# Patient Record
Sex: Male | Born: 1975 | Race: Black or African American | Hispanic: No | Marital: Married | Smoking: Current every day smoker
Health system: Southern US, Community
[De-identification: ages and names within clinical notes are randomized; demographics above are authoritative.]

## PROBLEM LIST (undated history)

## (undated) DIAGNOSIS — K5090 Crohn's disease, unspecified, without complications: Secondary | ICD-10-CM

## (undated) DIAGNOSIS — D649 Anemia, unspecified: Secondary | ICD-10-CM

## (undated) DIAGNOSIS — Z87442 Personal history of urinary calculi: Secondary | ICD-10-CM

## (undated) DIAGNOSIS — B009 Herpesviral infection, unspecified: Secondary | ICD-10-CM

## (undated) DIAGNOSIS — R011 Cardiac murmur, unspecified: Secondary | ICD-10-CM

## (undated) DIAGNOSIS — K589 Irritable bowel syndrome without diarrhea: Secondary | ICD-10-CM

## (undated) HISTORY — PX: LITHOTRIPSY: SUR834

## (undated) HISTORY — PX: WISDOM TOOTH EXTRACTION: SHX21

## (undated) HISTORY — PX: VASECTOMY: SHX75

## (undated) HISTORY — DX: Irritable bowel syndrome without diarrhea: K58.9

---

## 2012-03-01 ENCOUNTER — Other Ambulatory Visit (HOSPITAL_COMMUNITY): Payer: Self-pay

## 2012-03-02 ENCOUNTER — Ambulatory Visit: Admit: 2012-03-02 | Payer: Self-pay | Admitting: Urology

## 2012-03-02 SURGERY — CYSTOSCOPY, WITH CALCULUS REMOVAL USING BASKET
Anesthesia: Choice | Laterality: Right

## 2016-03-13 ENCOUNTER — Encounter (INDEPENDENT_AMBULATORY_CARE_PROVIDER_SITE_OTHER): Payer: Self-pay | Admitting: Internal Medicine

## 2016-03-26 ENCOUNTER — Ambulatory Visit (INDEPENDENT_AMBULATORY_CARE_PROVIDER_SITE_OTHER): Payer: Self-pay | Admitting: Internal Medicine

## 2016-03-26 ENCOUNTER — Encounter (INDEPENDENT_AMBULATORY_CARE_PROVIDER_SITE_OTHER): Payer: Self-pay | Admitting: Internal Medicine

## 2016-04-08 ENCOUNTER — Encounter (INDEPENDENT_AMBULATORY_CARE_PROVIDER_SITE_OTHER): Payer: Self-pay | Admitting: Internal Medicine

## 2016-04-08 ENCOUNTER — Ambulatory Visit (INDEPENDENT_AMBULATORY_CARE_PROVIDER_SITE_OTHER): Payer: Self-pay | Admitting: Internal Medicine

## 2016-04-08 DIAGNOSIS — K580 Irritable bowel syndrome with diarrhea: Secondary | ICD-10-CM

## 2016-04-08 DIAGNOSIS — K589 Irritable bowel syndrome without diarrhea: Secondary | ICD-10-CM

## 2016-04-08 HISTORY — DX: Irritable bowel syndrome, unspecified: K58.9

## 2016-04-08 MED ORDER — DICYCLOMINE HCL 10 MG PO CAPS: 10.0000 mg | ORAL_CAPSULE | Freq: Two times a day (BID) | ORAL | 3 refills | Status: DC

## 2016-04-08 NOTE — Progress Notes (Signed)
   Subjective:    Patient ID: Paul Morales, male    DOB: 28-Sep-1975, 40 y.o.   MRN: [id number]  HPI Referred by Dr. Alean Rinne for diarrhea.  Hx of IBS for greater than 20 years.  Usually occur after meals and aggravated by certain foods. Stools are always like water.  If he eats chicken salad he will have a formed stool but then the next 2 BM  will be liquid. He has 3-4 stools a day. No melena or BRRB.  No family hx of colon cancer. Appetite is good. No weight loss.  No abdominal pain,.      Review of Systems Past Medical History:  Diagnosis Date  . IBS (irritable bowel syndrome) 04/08/2016    No past surgical history on file.  Allergies  Allergen Reactions  . Tramadol     Vertigo, dizziness, nausea    No current outpatient prescriptions on file prior to visit.   No current facility-administered medications on file prior to visit.        Objective:   Physical Exam Blood pressure 120/70, pulse 72, temperature 98 F (36.7 C), height 5' 5"  (1.651 m), weight 129 lb (58.5 kg). Alert and oriented. Skin warm and dry. Oral mucosa is moist.   . Sclera anicteric, conjunctivae is pink. Thyroid not enlarged. No cervical lymphadenopathy. Lungs clear. Heart regular rate and rhythm.  Abdomen is soft. Bowel sounds are positive. No hepatomegaly. No abdominal masses felt. No tenderness.  No edema to lower extremities.  Stool brown and guaiac negative.       Assessment & Plan:  Probable IBS. Discussed with Dr. Laural Golden. Dicyclomine 33m BID. OV in 6 weeks. If not better, will need a colonoscopy.

## 2016-04-08 NOTE — Patient Instructions (Addendum)
Rx for Dicyclomine 10mg  BID.  OV in 6 weeks.

## 2016-05-21 ENCOUNTER — Ambulatory Visit (INDEPENDENT_AMBULATORY_CARE_PROVIDER_SITE_OTHER): Payer: Self-pay | Admitting: Internal Medicine

## 2016-05-21 ENCOUNTER — Encounter (INDEPENDENT_AMBULATORY_CARE_PROVIDER_SITE_OTHER): Payer: Self-pay | Admitting: Internal Medicine

## 2016-06-04 ENCOUNTER — Ambulatory Visit (INDEPENDENT_AMBULATORY_CARE_PROVIDER_SITE_OTHER): Payer: Self-pay | Admitting: Internal Medicine

## 2016-11-28 ENCOUNTER — Encounter (INDEPENDENT_AMBULATORY_CARE_PROVIDER_SITE_OTHER): Payer: Self-pay | Admitting: Internal Medicine

## 2016-12-04 ENCOUNTER — Ambulatory Visit (INDEPENDENT_AMBULATORY_CARE_PROVIDER_SITE_OTHER): Payer: 59 | Admitting: Internal Medicine

## 2016-12-04 ENCOUNTER — Encounter (INDEPENDENT_AMBULATORY_CARE_PROVIDER_SITE_OTHER): Payer: Self-pay

## 2016-12-04 ENCOUNTER — Other Ambulatory Visit (INDEPENDENT_AMBULATORY_CARE_PROVIDER_SITE_OTHER): Payer: Self-pay | Admitting: Internal Medicine

## 2016-12-04 ENCOUNTER — Encounter (INDEPENDENT_AMBULATORY_CARE_PROVIDER_SITE_OTHER): Payer: Self-pay | Admitting: *Deleted

## 2016-12-04 ENCOUNTER — Encounter (INDEPENDENT_AMBULATORY_CARE_PROVIDER_SITE_OTHER): Payer: Self-pay | Admitting: Internal Medicine

## 2016-12-04 ENCOUNTER — Telehealth (INDEPENDENT_AMBULATORY_CARE_PROVIDER_SITE_OTHER): Payer: Self-pay | Admitting: *Deleted

## 2016-12-04 VITALS — BP 110/70 | HR 84 | Temp 97.8°F | Ht 65.0 in | Wt 120.5 lb

## 2016-12-04 DIAGNOSIS — K529 Noninfective gastroenteritis and colitis, unspecified: Secondary | ICD-10-CM

## 2016-12-04 LAB — CBC WITH DIFFERENTIAL/PLATELET
BASOS PCT: 0 %
Basophils Absolute: 0 cells/uL (ref 0–200)
EOS ABS: 89 {cells}/uL (ref 15–500)
Eosinophils Relative: 1 %
HEMATOCRIT: 33.7 % — AB (ref 38.5–50.0)
Hemoglobin: 10.6 g/dL — ABNORMAL LOW (ref 13.2–17.1)
Lymphocytes Relative: 25 %
Lymphs Abs: 2225 cells/uL (ref 850–3900)
MCH: 27.8 pg (ref 27.0–33.0)
MCHC: 31.5 g/dL — ABNORMAL LOW (ref 32.0–36.0)
MCV: 88.5 fL (ref 80.0–100.0)
MONO ABS: 712 {cells}/uL (ref 200–950)
MONOS PCT: 8 %
MPV: 8.6 fL (ref 7.5–12.5)
NEUTROS ABS: 5874 {cells}/uL (ref 1500–7800)
Neutrophils Relative %: 66 %
PLATELETS: 703 10*3/uL — AB (ref 140–400)
RBC: 3.81 MIL/uL — ABNORMAL LOW (ref 4.20–5.80)
RDW: 15.6 % — AB (ref 11.0–15.0)
WBC: 8.9 10*3/uL (ref 3.8–10.8)

## 2016-12-04 LAB — HEPATIC FUNCTION PANEL
ALT: 8 U/L — ABNORMAL LOW (ref 9–46)
AST: 9 U/L — ABNORMAL LOW (ref 10–40)
Albumin: 2.5 g/dL — ABNORMAL LOW (ref 3.6–5.1)
Alkaline Phosphatase: 57 U/L (ref 40–115)
BILIRUBIN INDIRECT: 0.1 mg/dL — AB (ref 0.2–1.2)
Bilirubin, Direct: 0.1 mg/dL (ref <=0.2)
TOTAL PROTEIN: 5.5 g/dL — AB (ref 6.1–8.1)
Total Bilirubin: 0.2 mg/dL (ref 0.2–1.2)

## 2016-12-04 MED ORDER — CIPROFLOXACIN HCL 500 MG PO TABS: 500.0000 mg | ORAL_TABLET | Freq: Two times a day (BID) | ORAL | 0 refills | Status: DC

## 2016-12-04 MED ORDER — METRONIDAZOLE 500 MG PO TABS: 500.0000 mg | ORAL_TABLET | Freq: Two times a day (BID) | ORAL | 0 refills | Status: DC

## 2016-12-04 MED ORDER — PEG 3350-KCL-NA BICARB-NACL 420 G PO SOLR: 4000.0000 mL | Freq: Once | ORAL | 0 refills | Status: AC

## 2016-12-04 NOTE — Progress Notes (Addendum)
   Subjective:    Patient ID: Paul Morales, male    DOB: 1975-12-13, 41 y.o.   MRN: [id number]  HPIReferred by Roma KayserKatie Skillman enteritis, chills.  [facility] with abdominal pain, diarrhea, nausea and vomiting. CT abdomen/pelvis   with CM on 11/27/2016 revealed diffuse wall thickening and edema involving much of the small bowel and portions of the rt colon. Changes are likley to represent enteritis. Could indicate infectious enteritis or inflammatory bowel disease. He tells me he had been sick.  Mother's day, he had abdominal pain.  Foods upset his abdomen.  He ate at [employer] Mother's day. He began to have stomach cramps. He thought he had a virus.  He denies having any fever. He started drinking ginger ale which did not help. A week later, he took Kaopectate which did not help. He also had diarrhea. He says he has lost 15 pounds over the past weeks.  He says his stools ar still loose. Averaging about 3 stools a day and all are watery. His appetite is better.  He has always had diarrhea since 2001.  He feels 70% better.    11/27/2016 NA 136, K 2.9, BUN 7, total bili 0.2, AST 7.0, LT 5, ALP 88, Albumin 3.1. H and H 11.7 and 34.4. WBC 11.8  Rx for Cipro and Flagyl x 7 days. Will be thru in one day.   Weight in 2017 129. Today his weight 120.5.   Review of Systems Past Medical History:  Diagnosis Date  . IBS (irritable bowel syndrome) 04/08/2016  . IBS (irritable bowel syndrome)     Past Surgical History:  Procedure Laterality Date  . LITHOTRIPSY    . VASECTOMY      Allergies  Allergen Reactions  . Tramadol     Vertigo, dizziness, nausea    No current outpatient prescriptions on file prior to visit.   No current facility-administered medications on file prior to visit.    Current Outpatient Prescriptions  Medication Sig Dispense Refill  . ciprofloxacin (CIPRO) 500 MG tablet Take 500 mg by mouth 2 (two) times daily.    . metroNIDAZOLE (FLAGYL) 500 MG  tablet Take 500 mg by mouth 2 (two) times daily.     No current facility-administered medications for this visit.         Objective:   Physical Exam Blood pressure 110/70, pulse 84, temperature 97.8 F (36.6 C), height 5\' 5"  (1.651 m), weight 120 lb 8 oz (54.7 kg). Alert and oriented. Skin warm and dry. Oral mucosa is moist.   . Sclera anicteric, conjunctivae is pink. Thyroid not enlarged. No cervical lymphadenopathy. Lungs clear. Heart regular rate and rhythm.  Abdomen is soft. Bowel sounds are positive. No hepatomegaly. No abdominal masses felt. No tenderness.  No edema to lower extremities.           Assessment & Plan:  Entieritis. ? Infectious vs Inflammatory bowel disease. Needs colonoscopy. Will cover him with 7 more days of Cipro and Flagyl.

## 2016-12-04 NOTE — Telephone Encounter (Signed)
Patient needs trilyte 

## 2016-12-04 NOTE — Patient Instructions (Signed)
Labs. The risks of bleeding, perforation and infection were reviewed with patient.

## 2016-12-05 ENCOUNTER — Encounter (INDEPENDENT_AMBULATORY_CARE_PROVIDER_SITE_OTHER): Payer: Self-pay

## 2016-12-05 LAB — C-REACTIVE PROTEIN: CRP: 24.6 mg/L — ABNORMAL HIGH (ref <8.0)

## 2016-12-08 ENCOUNTER — Telehealth (INDEPENDENT_AMBULATORY_CARE_PROVIDER_SITE_OTHER): Payer: Self-pay | Admitting: Internal Medicine

## 2016-12-08 LAB — GASTROINTESTINAL PATHOGEN PANEL PCR
C. difficile Tox A/B, PCR: NOT DETECTED
Campylobacter, PCR: NOT DETECTED
Cryptosporidium, PCR: NOT DETECTED
E COLI (ETEC) LT/ST, PCR: NOT DETECTED
E COLI (STEC) STX1/STX2, PCR: NOT DETECTED
E COLI 0157, PCR: NOT DETECTED
Giardia lamblia, PCR: NOT DETECTED
Norovirus, PCR: NOT DETECTED
Rotavirus A, PCR: NOT DETECTED
SALMONELLA, PCR: NOT DETECTED
SHIGELLA, PCR: NOT DETECTED

## 2016-12-08 NOTE — Telephone Encounter (Signed)
Ann, can u move colonoscopy up at all possible

## 2016-12-09 NOTE — Telephone Encounter (Signed)
TCS moved to 12/22/16, patient aware

## 2016-12-22 ENCOUNTER — Encounter (HOSPITAL_COMMUNITY)
Admission: RE | Disposition: A | Discharge status: Home / Self Care | Payer: Self-pay | Source: Ambulatory Visit | Attending: Internal Medicine

## 2016-12-22 ENCOUNTER — Ambulatory Visit (HOSPITAL_COMMUNITY)
Admission: RE | Admit: 2016-12-22 | Discharge: 2016-12-22 | Disposition: A | Discharge status: Home / Self Care | Payer: 59 | Source: Ambulatory Visit | Attending: Internal Medicine | Admitting: Internal Medicine

## 2016-12-22 ENCOUNTER — Encounter (HOSPITAL_COMMUNITY): Payer: Self-pay | Admitting: *Deleted

## 2016-12-22 DIAGNOSIS — R634 Abnormal weight loss: Secondary | ICD-10-CM | POA: Insufficient documentation

## 2016-12-22 DIAGNOSIS — D649 Anemia, unspecified: Secondary | ICD-10-CM | POA: Insufficient documentation

## 2016-12-22 DIAGNOSIS — K633 Ulcer of intestine: Secondary | ICD-10-CM

## 2016-12-22 DIAGNOSIS — R109 Unspecified abdominal pain: Secondary | ICD-10-CM

## 2016-12-22 DIAGNOSIS — K6389 Other specified diseases of intestine: Secondary | ICD-10-CM | POA: Insufficient documentation

## 2016-12-22 DIAGNOSIS — F17290 Nicotine dependence, other tobacco product, uncomplicated: Secondary | ICD-10-CM | POA: Insufficient documentation

## 2016-12-22 DIAGNOSIS — K529 Noninfective gastroenteritis and colitis, unspecified: Secondary | ICD-10-CM | POA: Insufficient documentation

## 2016-12-22 DIAGNOSIS — K3189 Other diseases of stomach and duodenum: Secondary | ICD-10-CM

## 2016-12-22 DIAGNOSIS — K580 Irritable bowel syndrome with diarrhea: Secondary | ICD-10-CM | POA: Insufficient documentation

## 2016-12-22 DIAGNOSIS — R933 Abnormal findings on diagnostic imaging of other parts of digestive tract: Secondary | ICD-10-CM

## 2016-12-22 DIAGNOSIS — Z79899 Other long term (current) drug therapy: Secondary | ICD-10-CM | POA: Insufficient documentation

## 2016-12-22 HISTORY — PX: COLONOSCOPY: SHX5424

## 2016-12-22 SURGERY — COLONOSCOPY
Anesthesia: Moderate Sedation

## 2016-12-22 MED ORDER — HYOSCYAMINE SULFATE 0.125 MG SL SUBL: 0.1250 mg | SUBLINGUAL_TABLET | Freq: Four times a day (QID) | SUBLINGUAL | 0 refills | Status: DC | PRN

## 2016-12-22 MED ORDER — MIDAZOLAM HCL 5 MG/5ML IJ SOLN
INTRAMUSCULAR | Status: DC | PRN
  Administered 2016-12-22 (×5): 2 mg via INTRAVENOUS

## 2016-12-22 MED ORDER — MIDAZOLAM HCL 5 MG/5ML IJ SOLN
INTRAMUSCULAR | Status: AC
  Filled 2016-12-22: qty 10

## 2016-12-22 MED ORDER — TUBERCULIN PPD 5 UNIT/0.1ML ID SOLN
5.0000 [IU] | Freq: Once | INTRADERMAL | Status: DC
  Administered 2016-12-22: 5 [IU] via INTRADERMAL
  Filled 2016-12-22: qty 0.1

## 2016-12-22 MED ORDER — MEPERIDINE HCL 50 MG/ML IJ SOLN
INTRAMUSCULAR | Status: DC | PRN
  Administered 2016-12-22 (×3): 25 mg via INTRAVENOUS

## 2016-12-22 MED ORDER — SODIUM CHLORIDE 0.9 % IV SOLN
INTRAVENOUS | Status: DC
  Administered 2016-12-22: 1000 mL via INTRAVENOUS

## 2016-12-22 MED ORDER — STERILE WATER FOR IRRIGATION IR SOLN
Status: DC | PRN
  Administered 2016-12-22: 15:00:00

## 2016-12-22 MED ORDER — MEPERIDINE HCL 50 MG/ML IJ SOLN
INTRAMUSCULAR | Status: AC
  Filled 2016-12-22: qty 1

## 2016-12-22 NOTE — Discharge Instructions (Signed)
Hyoscyamine sublingual 1 tablet up to 4 times a day as needed for abdominal pain or cramping. Resume usual diet. No driving for 24 hours. Physician will call with results of biopsy and stool studies.   Upper Endoscopy, Care After Refer to this sheet in the next few weeks. These instructions provide you with information about caring for yourself after your procedure. Your health care provider may also give you more specific instructions. Your treatment has been planned according to current medical practices, but problems sometimes occur. Call your health care provider if you have any problems or questions after your procedure. What can I expect after the procedure? After the procedure, it is common to have:  A sore throat.  Bloating.  Nausea.  Follow these instructions at home:  Follow instructions from your health care provider about what to eat or drink after your procedure.  Return to your normal activities as told by your health care provider. Ask your health care provider what activities are safe for you.  Take over-the-counter and prescription medicines only as told by your health care provider.  Do not drive for 24 hours if you received a sedative.  Keep all follow-up visits as told by your health care provider. This is important. Contact a health care provider if:  You have a sore throat that lasts longer than one day.  You have trouble swallowing. Get help right away if:  You have a fever.  You vomit blood or your vomit looks like coffee grounds.  You have bloody, black, or tarry stools.  You have a severe sore throat or you cannot swallow.  You have difficulty breathing.  You have severe pain in your chest or belly. This information is not intended to replace advice given to you by your health care provider. Make sure you discuss any questions you have with your health care provider. Document Released: 12/23/2011 Document Revised: 11/29/2015 Document Reviewed:  04/05/2015 Elsevier Interactive Patient Education  2017 [employer]

## 2016-12-22 NOTE — Op Note (Addendum)
[facility] Patient Name: Paul Morales Procedure Date: 12/22/2016 2:28 PM MRN: [id number] Date of Birth: [birth year] Attending MD: Hildred Laser , MD CSN: [id number] Age: 41 Admit Type: Outpatient Procedure:                Colonoscopy Indications:              Abdominal pain, Abnormal CT of the GI tract Providers:                Hildred Laser, MD, Otis Peak B. Sharon Seller, RN, Aram Candela Referring MD:             Curlene Labrum, MD Medicines:                Meperidine 75 mg IV, Midazolam 10 mg IV Complications:            No immediate complications. Estimated Blood Loss:     Estimated blood loss was minimal. Procedure:                Pre-Anesthesia Assessment:                           - Prior to the procedure, a History and Physical                            was performed, and patient medications and                            allergies were reviewed. The patient's tolerance of                            previous anesthesia was also reviewed. The risks                            and benefits of the procedure and the sedation                            options and risks were discussed with the patient.                            All questions were answered, and informed consent                            was obtained. Prior Anticoagulants: The patient has                            taken no previous anticoagulant or antiplatelet                            agents. ASA Grade Assessment: I - A normal, healthy                            patient. After reviewing the risks and benefits,  the patient was deemed in satisfactory condition to                            undergo the procedure.                           After obtaining informed consent, the colonoscope                            was passed under direct vision. Throughout the                            procedure, the patient's blood pressure, pulse, and        oxygen saturations were monitored continuously. The                            [id number] ([id number]) scope was introduced through                            the anus and advanced to the the terminal ileum,                            with identification of the appendiceal orifice and                            IC valve. The colonoscopy was performed without                            difficulty. The patient tolerated the procedure                            well. The quality of the bowel preparation was                            adequate. The terminal ileum, ileocecal valve,                            appendiceal orifice, and rectum were photographed. Scope In: 3:00:24 PM Scope Out: 3:30:30 PM Scope Withdrawal Time: 0 hours 16 minutes 5 seconds  Total Procedure Duration: 0 hours 30 minutes 6 seconds  Findings:      The perianal and digital rectal examinations were normal.      The terminal ileum contained multiple scattered erosions & ulcers..       Biopsies were taken with a cold forceps for histology. The pathology       specimen was placed into Bottle Number 1.      A few scattered erosions were found in the sigmoid colon, at the hepatic       flexure, in the ascending colon and in the cecum. Biopsies were taken       with a cold forceps for histology. The pathology specimen was placed       into Bottle Number 2.      The retroflexed view of the distal rectum and anal verge was normal and       showed no anal or rectal  abnormalities. Impression:               - Multiple erosions & ulcers in the terminal ileum.                            Biopsied.                           - A few erosions in the sigmoid colon, at the                            hepatic flexure, in the ascending colon and in the                            cecum. Biopsy taken from ascending colon as well as                            sigmoid colon and submitted separately.                           comment:  Endoscopic findings concerning for                            inflammatory bowel disease. Moderate Sedation:      Moderate (conscious) sedation was administered by the endoscopy nurse       and supervised by the endoscopist. The following parameters were       monitored: oxygen saturation, heart rate, blood pressure, CO2       capnography and response to care. Total physician intraservice time was       36 minutes. Recommendation:           - Patient has a contact number available for                            emergencies. The signs and symptoms of potential                            delayed complications were discussed with the                            patient. Return to normal activities tomorrow.                            Written discharge instructions were provided to the                            patient.                           - Resume previous diet today.                           - Continue present medications.                           - Await pathology results.                           -  stool sample taken for culture and O&P.                           - Hyoscyamine sublingual 4 times a day when                            necessary.                           - PPD.                           - Repeat colonoscopy in 10 years. Procedure Code(s):        --- Professional ---                           [phone], Colonoscopy, flexible; with biopsy, single                            or multiple                           99152, Moderate sedation services provided by the                            same physician or other qualified health care                            professional performing the diagnostic or                            therapeutic service that the sedation supports,                            requiring the presence of an independent trained                            observer to assist in the monitoring of the                            patient's level of  consciousness and physiological                            status; initial 15 minutes of intraservice time,                            patient age 42 years or older                           [phone], Moderate sedation services; each additional                            15 minutes intraservice time Diagnosis Code(s):        --- Professional ---  K63.3, Ulcer of intestine                           R10.9, Unspecified abdominal pain                           R93.3, Abnormal findings on diagnostic imaging of                            other parts of digestive tract CPT copyright 2016 American Medical Association. All rights reserved. The codes documented in this report are preliminary and upon coder review may  be revised to meet current compliance requirements. Hildred Laser, MD Hildred Laser, MD 12/22/2016 3:42:12 PM This report has been signed electronically. Number of Addenda: 0

## 2016-12-22 NOTE — H&P (Signed)
Paul Morales is an 40 y.o. male.   Chief Complaint: Patient is here for colonoscopy and ileoscopy. HPI: A shunt is 41 year old African-American male who presents with over one month history of abdominal pain with inability to eat and 14 pound weight loss. He was seen in emergency room at [facility] on 11/27/2016 and underwent abdominopelvic CT which revealed thickening of multiple loops of small bowel as well as right colon. He was felt to have an infection. He was treated with antibiotic His pain has not resolved completely. He was seen in the office recently. GI pathogen panel was negative. CRP was abnormal at 24.6(normal less than 8) and his H&H was 10.6 and 33.7. He also had thrombocytosis. He denies melena or rectal bleeding. He has 3-4 stools per day. All of his stools are loose and watery. Family history is negative for IBD.   Past Medical History:  Diagnosis Date  . IBS (irritable bowel syndrome) 04/08/2016  . IBS (irritable bowel syndrome)     Past Surgical History:  Procedure Laterality Date  . LITHOTRIPSY    . VASECTOMY      Family History  Problem Relation Age of Onset  . Diabetes Mother   . Hypertension Father    Social History:  reports that he has been smoking Cigars.  He has never used smokeless tobacco. He reports that he uses drugs, including Marijuana. He reports that he does not drink alcohol.  Allergies:  Allergies  Allergen Reactions  . Tramadol     Vertigo, dizziness, nausea    Medications Prior to Admission  Medication Sig Dispense Refill  . ciprofloxacin (CIPRO) 500 MG tablet Take 500 mg by mouth 2 (two) times daily.    . ciprofloxacin (CIPRO) 500 MG tablet Take 1 tablet (500 mg total) by mouth 2 (two) times daily. 14 tablet 0  . metroNIDAZOLE (FLAGYL) 500 MG tablet Take 1 tablet (500 mg total) by mouth 2 (two) times daily. 14 tablet 0    No results found for this or any previous visit (from the past 48 hour(s)). No results found.  ROS  Blood pressure  111/75, pulse 63, temperature 98.1 F (36.7 C), temperature source Oral, resp. rate 18, height 5' 5"  (1.651 m), weight 118 lb (53.5 kg), SpO2 100 %. Physical Exam  Constitutional:  Well-developed thin male in NAD.  HENT:  Mouth/Throat: Oropharynx is clear and moist.  Eyes: Conjunctivae are normal. No scleral icterus.  Neck: No thyromegaly present.  Cardiovascular: Normal rate and regular rhythm.   Murmur (faint SEM at left sternal border.) heard. Respiratory: Effort normal and breath sounds normal.  GI: Soft. He exhibits no distension. There is no tenderness.  Musculoskeletal: He exhibits no edema.  Lymphadenopathy:    He has no cervical adenopathy.  Neurological: He is alert.  Skin: Skin is warm and dry.     Assessment/Plan Abdominal pain and diarrhea. Weight loss anemia and abnormal imaging. Diagnostic colonoscopy.  Hildred Laser, MD 12/22/2016, 2:47 PM

## 2016-12-23 LAB — GASTROINTESTINAL PANEL BY PCR, STOOL (REPLACES STOOL CULTURE)
Adenovirus F40/41: NOT DETECTED
Astrovirus: NOT DETECTED
CAMPYLOBACTER SPECIES: NOT DETECTED
Cryptosporidium: NOT DETECTED
Cyclospora cayetanensis: NOT DETECTED
ENTEROAGGREGATIVE E COLI (EAEC): NOT DETECTED
Entamoeba histolytica: NOT DETECTED
Enteropathogenic E coli (EPEC): NOT DETECTED
Enterotoxigenic E coli (ETEC): NOT DETECTED
Giardia lamblia: NOT DETECTED
NOROVIRUS GI/GII: NOT DETECTED
PLESIMONAS SHIGELLOIDES: NOT DETECTED
ROTAVIRUS A: NOT DETECTED
SALMONELLA SPECIES: NOT DETECTED
SHIGA LIKE TOXIN PRODUCING E COLI (STEC): NOT DETECTED
SHIGELLA/ENTEROINVASIVE E COLI (EIEC): NOT DETECTED
Sapovirus (I, II, IV, and V): NOT DETECTED
Vibrio cholerae: NOT DETECTED
Vibrio species: NOT DETECTED
Yersinia enterocolitica: NOT DETECTED

## 2016-12-25 ENCOUNTER — Encounter (HOSPITAL_COMMUNITY): Payer: Self-pay | Admitting: Internal Medicine

## 2016-12-25 ENCOUNTER — Other Ambulatory Visit (INDEPENDENT_AMBULATORY_CARE_PROVIDER_SITE_OTHER): Payer: Self-pay | Admitting: Internal Medicine

## 2016-12-25 ENCOUNTER — Telehealth (INDEPENDENT_AMBULATORY_CARE_PROVIDER_SITE_OTHER): Payer: Self-pay | Admitting: Internal Medicine

## 2016-12-25 MED ORDER — PREDNISONE 10 MG PO TABS: 30.0000 mg | ORAL_TABLET | Freq: Every day | ORAL | 0 refills | Status: DC

## 2016-12-25 NOTE — Telephone Encounter (Signed)
Forwarded to Dr.[name]. 

## 2016-12-25 NOTE — Telephone Encounter (Signed)
Patient called, stated that Dr. Laural Golden called him yesterday regarding results.  He is anxious to get those.  Home - [phone] Cell - [phone] - call after 5pm

## 2016-12-26 NOTE — Telephone Encounter (Signed)
Patient stop by today for me to read PPD result. PPD is negative.

## 2016-12-26 NOTE — Telephone Encounter (Signed)
addressed

## 2016-12-26 NOTE — Telephone Encounter (Signed)
Patient was called yesterday and given results of biopsy

## 2016-12-26 NOTE — Telephone Encounter (Signed)
Patient was called yesterday.

## 2016-12-29 LAB — OVA + PARASITE EXAM

## 2016-12-29 LAB — O&P RESULT

## 2017-01-08 NOTE — Progress Notes (Signed)
Sent a staff message to [place] to see when we can get this patient in.  The patient is not able to come in on 01/13/17, he would like to come on the 12th, which he stated he told Dr. Laural Golden this.

## 2017-01-15 ENCOUNTER — Encounter (INDEPENDENT_AMBULATORY_CARE_PROVIDER_SITE_OTHER): Payer: Self-pay | Admitting: Internal Medicine

## 2017-01-15 ENCOUNTER — Ambulatory Visit (INDEPENDENT_AMBULATORY_CARE_PROVIDER_SITE_OTHER): Payer: Self-pay | Admitting: Internal Medicine

## 2017-01-15 VITALS — BP 124/72 | HR 68 | Temp 98.2°F | Ht 65.0 in | Wt 123.4 lb

## 2017-01-15 DIAGNOSIS — K50818 Crohn's disease of both small and large intestine with other complication: Secondary | ICD-10-CM

## 2017-01-15 MED ORDER — PREDNISONE 10 MG PO TABS: 30.0000 mg | ORAL_TABLET | Freq: Every day | ORAL | 0 refills | Status: DC

## 2017-01-15 NOTE — Patient Instructions (Signed)
Prednisone 30 mg by mouth every morning. Can take first dose this evening. Physician will call with results of blood tests when completed and with further recommendations.

## 2017-01-15 NOTE — Progress Notes (Signed)
Presenting complaint;  Follow-up for small and large bowel Crohn's disease.  Database and Subjective:  Patient is 41 year old African-American male who began to experience abdominal pain associated weight loss over 2 months ago. He was seen in emergency room and treated with antibiotics but without symptomatic relief. Evaluation in office revealed negative GI pathogen panel CRP was elevated and he was also anemic with hemoglobin of 10.6. She also had thrombocytosis. Prior imaging studies had revealed thickening of multiple loops of small bowel as well as a descending colon. He underwent colonoscopy on 12/22/2016 which revealed multiple erosions and ulcers involving terminal ileum as well as scattered colonic erosions. Biopsy was consistent with Crohn's disease. GI pathogen panel was repeated and was negative. O&P was also unremarkable. Patient was given prescription for Levsin to be use on as-needed basis and he was begun on prednisone. PPD was negative.  Patient now returns for follow-up visit. He does not feel any better. He did not fill his medications because he could not afford $100. He remains with abdominal cramping 3-4 stools per day and he states his abdomen makes loud noises. He has not lost any more weight. He does not take OTC NSAIDs. He states he lost his job and just started a new job.   Current Medications: Outpatient Encounter Prescriptions as of 01/15/2017  Medication Sig  . hyoscyamine (LEVSIN SL) 0.125 MG SL tablet Place 1 tablet (0.125 mg total) under the tongue every 6 (six) hours as needed. (Patient not taking: Reported on 01/15/2017)  . predniSONE (DELTASONE) 10 MG tablet Take 3 tablets (30 mg total) by mouth daily with breakfast. (Patient not taking: Reported on 01/15/2017)   No facility-administered encounter medications on file as of 01/15/2017.      Objective: Blood pressure 124/72, pulse 68, temperature 98.2 F (36.8 C), temperature source Oral, height 5' 5"  (1.651  m), weight 123 lb 6.4 oz (56 kg). Well-developed thin male in no acute distress. Conjunctiva is pink. Sclera is nonicteric Oropharyngeal mucosa is normal. No neck masses or thyromegaly noted. Cardiac exam with regular rhythm normal S1 and S2. No murmur or gallop noted. Lungs are clear to auscultation. Abdomen is flat. On palpation is soft with mild tenderness in hypogastric region. No organomegaly or masses. No LE edema or clubbing noted.   Assessment:  #1. Small and large bowel Crohn's disease. Patient is not taking prednisone as prescribed because he could not afford the medication. He tells me he has Medicaid which should not have cost him that much. Treatment options discussed with patient. Prednisone treatment is only for few weeks. Will start him on 6-MP if TPMT assay is normal otherwise will consider a biologic #2. 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   Plan:  Prednisone 30 mg by mouth every morning. Another prescription sent to [facility]. Will begin taper and 1-2 weeks depending on response. Patient will call office if he experiences any side effects of prednisone. Patient advised not to take OTC NSAIDs. Patient will go to the lab for TPMT assay. Office visit in 2 months.

## 2017-01-28 ENCOUNTER — Telehealth (INDEPENDENT_AMBULATORY_CARE_PROVIDER_SITE_OTHER): Payer: Self-pay | Admitting: Internal Medicine

## 2017-01-28 ENCOUNTER — Encounter (INDEPENDENT_AMBULATORY_CARE_PROVIDER_SITE_OTHER): Payer: Self-pay | Admitting: Internal Medicine

## 2017-01-28 NOTE — Telephone Encounter (Signed)
Patient called back again.  He stated his legs and feet are swollen and hurting.  He wants to know what to do.  [phone]

## 2017-01-28 NOTE — Telephone Encounter (Signed)
Patient called stated that Dr. Laural Golden put him on Prednisone for his Chron's and his legs are very swollen, real bad to the point they are starting to ache.  [phone]

## 2017-01-28 NOTE — Telephone Encounter (Signed)
Per Paul Renderri Setzer,Paul Morales the patient is to stop the Prednisone. We will talk with Dr.[name] and see what his recommendation will be. A detailed message was left on the patient's voicemail.

## 2017-01-28 NOTE — Telephone Encounter (Signed)
I am waiting to get a response from Dr.[name].

## 2017-01-29 ENCOUNTER — Telehealth (INDEPENDENT_AMBULATORY_CARE_PROVIDER_SITE_OTHER): Payer: Self-pay | Admitting: *Deleted

## 2017-01-29 MED ORDER — FUROSEMIDE 40 MG PO TABS: 40.0000 mg | ORAL_TABLET | Freq: Every day | ORAL | 0 refills | Status: DC | PRN

## 2017-01-29 NOTE — Telephone Encounter (Signed)
This is a follow up from the previous telephone message on 01/28/2017. The patient is to drop the Prednisone to 20 mg daily for 1 week, decreasing by 5 mg weekly. As the patient cannot just stop this medication. We will call in Lasix 40 mg take 1 by mouth daily as need for fluid #20 no refills. Potassium 20 mEq take 1 by mouth daily when taking the Lasix. #20 no refills. Advise the patient to cut back on his sodium intake, also ask the patient about the lab work that he was to have done.  Patient was called on 01/28/2017 and a message was left on his voicemail with these instructions/questions. The prescriptions are being e-scribed to the patients pharmacy.  I will make another attempt to call patient and hopefully be able to talk to him to insure that he understand these instructions.  [facility] [place]

## 2017-02-19 ENCOUNTER — Encounter (INDEPENDENT_AMBULATORY_CARE_PROVIDER_SITE_OTHER): Payer: Self-pay | Admitting: Internal Medicine

## 2017-02-19 ENCOUNTER — Ambulatory Visit (INDEPENDENT_AMBULATORY_CARE_PROVIDER_SITE_OTHER): Payer: Self-pay | Admitting: Internal Medicine

## 2017-03-04 ENCOUNTER — Telehealth (INDEPENDENT_AMBULATORY_CARE_PROVIDER_SITE_OTHER): Payer: Self-pay | Admitting: Internal Medicine

## 2017-03-04 ENCOUNTER — Ambulatory Visit (INDEPENDENT_AMBULATORY_CARE_PROVIDER_SITE_OTHER): Payer: Self-pay | Admitting: Internal Medicine

## 2017-03-04 NOTE — Telephone Encounter (Signed)
Patient called and lmoam that he was still at work that he needed to push his appointment to 4pm.  He showed up at the office at 4:15pm.  Terri told the patient that he needed to reschedule and he walked out.

## 2017-03-16 ENCOUNTER — Telehealth (INDEPENDENT_AMBULATORY_CARE_PROVIDER_SITE_OTHER): Payer: Self-pay | Admitting: Internal Medicine

## 2017-03-16 NOTE — Telephone Encounter (Signed)
Patient was called and given an appointment with Dr. Laural Golden for 03/19/17 at 4pm.  He was advised not to be late for his appointment.  He is having difficulty getting here with his work schedule.  He is going to call me tomorrow to let me know if this is going to work for him or not.

## 2017-03-19 ENCOUNTER — Encounter (INDEPENDENT_AMBULATORY_CARE_PROVIDER_SITE_OTHER): Payer: Self-pay | Admitting: Internal Medicine

## 2017-03-19 ENCOUNTER — Encounter (INDEPENDENT_AMBULATORY_CARE_PROVIDER_SITE_OTHER): Payer: Self-pay

## 2017-03-19 ENCOUNTER — Ambulatory Visit (INDEPENDENT_AMBULATORY_CARE_PROVIDER_SITE_OTHER): Payer: Self-pay | Admitting: Internal Medicine

## 2017-03-19 VITALS — BP 118/80 | HR 63 | Temp 97.9°F | Resp 18 | Ht 65.0 in | Wt 125.8 lb

## 2017-03-19 DIAGNOSIS — K50818 Crohn's disease of both small and large intestine with other complication: Secondary | ICD-10-CM

## 2017-03-19 MED ORDER — MERCAPTOPURINE 50 MG PO TABS: 75.0000 mg | ORAL_TABLET | Freq: Every day | ORAL | 5 refills | Status: DC

## 2017-03-19 MED ORDER — LOPERAMIDE HCL 2 MG PO CAPS: 2.0000 mg | ORAL_CAPSULE | Freq: Two times a day (BID) | ORAL | 5 refills | Status: DC | PRN

## 2017-03-19 NOTE — Patient Instructions (Signed)
Call if you experience any side effects with 6 mercaptopurine. Remember you cannot take aspirin Aleve ibuprofen or similar medications but can take Tylenol up to 2 g per day on as-needed basis for joint musculoskeletal pain or headache. Next blood work in one month.

## 2017-03-19 NOTE — Progress Notes (Signed)
Presenting complaint;  Follow-up for small and large bowel Crohn's disease.  Database andSubjective:  Patient is 41 year old African-American male who began to experience abdominal pain associated weight loss over 4 months ago. He was seen in emergency room and treated with antibiotics but without symptomatic relief. Evaluation in office revealed negative GI pathogen panel CRP was elevated and he was also anemic with hemoglobin of 10.6. She also had thrombocytosis. Prior imaging studies had revealed thickening of multiple loops of small bowel as well as a descending colon. He underwent colonoscopy on 12/22/2016 which revealed multiple erosions and ulcers involving terminal ileum as well as scattered colonic erosions. Biopsy was consistent with Crohn's disease. GI pathogen panel was repeated and was negative. O&P was also unremarkable. Patient was given prescription for Levsin to be use on as-needed basis and he was begun on prednisone. But he did not fill his prescription. He was last seen on 01/15/2017 and advised to take prednisone 30 mg daily. He was also given instructions regarding taper. About 2 weeks after his visit he called office stating he was retaining fluid. He was given prescription for furosemide and KCl and advised expedite prednisone taper.  Patient is here for scheduled visit accompanied by his daughter. He is not taking prednisone 5 mg daily. He does not feel much better. He does report having less diarrhea while he was in higher dose of prednisone. He is having anywhere from 2-6 bowel movements per day. Most of his stools are loose to unformed. He denies melena or rectal bleeding nausea vomiting fever or chills. He continues to complain of burning and cramping abdominal pain. It is primarily across mid and lower abdomen. He has good appetite but he's afraid to eat. He has only gained 2 pounds since his last visit.    Current Medications: Outpatient Encounter Prescriptions as of  03/19/2017  Medication Sig  . POTASSIUM CHLORIDE PO Take by mouth. Patient states that he takes this medication once a week.  . predniSONE (DELTASONE) 10 MG tablet Take 3 tablets (30 mg total) by mouth daily with breakfast.  . furosemide (LASIX) 40 MG tablet Take 1 tablet (40 mg total) by mouth daily as needed for fluid. (Patient not taking: Reported on 03/19/2017)   No facility-administered encounter medications on file as of 03/19/2017.      Objective: Blood pressure 118/80, pulse 63, temperature 97.9 F (36.6 C), temperature source Oral, resp. rate 18, height 5' 5"  (1.651 m), weight 125 lb 12.8 oz (57.1 kg). Patient is alert and in no acute distress. Conjunctiva is pink. Sclera is nonicteric Oropharyngeal mucosa is normal. No neck masses or thyromegaly noted. Cardiac exam with regular rhythm normal S1 and S2. No murmur or gallop noted. Lungs are clear to auscultation. Abdomen is flat. Bowel sounds are normal. On palpation it is soft with mild tenderness in both iliac fossae. No organomegaly or masses. No LE edema or clubbing noted.  Labs/studies Results:  TPMT assay  25.6 Units/mL RBC performed on 02/06/2017  Assessment:  #1. Ileocolonic Crohn's disease. He had minimal symptomatic improvement with prednisone but he could not take higher dose for long because of fluid retention. He remains with mild to moderate symptoms. TPMT assay is normal. Therefore and start him on 6-MP. Since his disease is not severe but hold off biologic therapy.  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  Plan:  Stop prednisone after next week. Begin 6-MP 75 mg by mouth daily. MVI 1 tablet by mouth daily. CBC with differential in 4 weeks.  Office visit in 3 months.

## 2017-03-26 ENCOUNTER — Telehealth (INDEPENDENT_AMBULATORY_CARE_PROVIDER_SITE_OTHER): Payer: Self-pay | Admitting: *Deleted

## 2017-03-26 NOTE — Telephone Encounter (Signed)
Rec'd a PA request from the patient's Pharmacy for Mercaptopurine 50 mg tablet. Interaction Number contact number [phone]. I called the patient's Insurance   [employer]renda. She states that the patient is currently enrolled in the [facility] and this does not cover medications. She advised that he needed to call his Caseworker to get him into the correct program.  I called the patient's number that was provided and left a detailed message with this information on it. I ask that he call our office to confirm that he rec'd message.  I will also inform the [facility] of this.

## 2017-07-13 ENCOUNTER — Other Ambulatory Visit (INDEPENDENT_AMBULATORY_CARE_PROVIDER_SITE_OTHER): Payer: Self-pay | Admitting: *Deleted

## 2017-07-13 DIAGNOSIS — K50819 Crohn's disease of both small and large intestine with unspecified complications: Secondary | ICD-10-CM

## 2017-07-13 MED ORDER — MERCAPTOPURINE 50 MG PO TABS: 75.0000 mg | ORAL_TABLET | Freq: Every day | ORAL | 5 refills | Status: DC

## 2017-07-31 ENCOUNTER — Other Ambulatory Visit (INDEPENDENT_AMBULATORY_CARE_PROVIDER_SITE_OTHER): Payer: Self-pay | Admitting: *Deleted

## 2017-07-31 ENCOUNTER — Encounter (INDEPENDENT_AMBULATORY_CARE_PROVIDER_SITE_OTHER): Payer: Self-pay | Admitting: *Deleted

## 2017-07-31 DIAGNOSIS — K50819 Crohn's disease of both small and large intestine with unspecified complications: Secondary | ICD-10-CM

## 2017-08-17 ENCOUNTER — Other Ambulatory Visit (INDEPENDENT_AMBULATORY_CARE_PROVIDER_SITE_OTHER): Payer: Self-pay | Admitting: *Deleted

## 2017-08-17 ENCOUNTER — Telehealth (INDEPENDENT_AMBULATORY_CARE_PROVIDER_SITE_OTHER): Payer: Self-pay | Admitting: *Deleted

## 2017-08-17 DIAGNOSIS — R7982 Elevated C-reactive protein (CRP): Secondary | ICD-10-CM

## 2017-08-17 NOTE — Telephone Encounter (Signed)
The patient is to take the Prednisone until we gett he lab results back.

## 2017-08-17 NOTE — Telephone Encounter (Signed)
Talked with the patient and he states that his work will not allow him to get Lab work done during the day. He ask if [facility] could [facility] and he was advised that he could and the orders were faxed there.   He also complains of bones , back all hurt to touch. He says he thinks that it is the 6 MP, then his wife reminded him that he had been on the Prednisone ordered by another physician prior to coming to us and that we were the ones who weaned him off of it. He is having a hard time with the patient also upon waking up from a nap or when he dozes off.  He would appreciate a call back , he says that he works from 6:30 am - 4 pm ,unless he is on a break he cannot answer. If that be the case he will call back when he can.

## 2017-08-17 NOTE — Telephone Encounter (Signed)
I did talk directly with the patient.

## 2017-08-17 NOTE — Telephone Encounter (Signed)
Per Dr.[name] the patient will need to start taking the Prednisone 10 mg again. If this discomfort continues or worsens we will know that it is associated with the Prednisone. Dr.[name] has also ordered additional lab work on the patient . He was called and a message was left on his voicemail.

## 2017-08-17 NOTE — Telephone Encounter (Signed)
Patient called left message requesting for us to schedule him for another lab date because 08/10/17 was a work day and it has to be mornings and the weekend because he gets points at work arriving late. Patient also requested for someone to call him regarding the medicine Dr Karilyn Cotaehman has put him on, patient stated it makes him drowsy and woozy. Please advise patient  Let patient know he can go to lab whenever is convenient for him they don't schedule appointments.    [phone]

## 2017-08-31 ENCOUNTER — Encounter: Payer: Self-pay | Admitting: Internal Medicine

## 2017-09-15 ENCOUNTER — Ambulatory Visit (INDEPENDENT_AMBULATORY_CARE_PROVIDER_SITE_OTHER): Payer: Self-pay | Admitting: Internal Medicine

## 2017-09-17 ENCOUNTER — Ambulatory Visit (INDEPENDENT_AMBULATORY_CARE_PROVIDER_SITE_OTHER): Payer: Self-pay | Admitting: Internal Medicine

## 2018-07-29 ENCOUNTER — Encounter (INDEPENDENT_AMBULATORY_CARE_PROVIDER_SITE_OTHER): Payer: Self-pay | Admitting: Orthopaedic Surgery

## 2018-07-29 ENCOUNTER — Ambulatory Visit (INDEPENDENT_AMBULATORY_CARE_PROVIDER_SITE_OTHER): Payer: Managed Care, Other (non HMO) | Admitting: Orthopaedic Surgery

## 2018-07-29 VITALS — BP 120/80 | HR 84 | Ht 65.0 in | Wt 126.0 lb

## 2018-07-29 DIAGNOSIS — M542 Cervicalgia: Secondary | ICD-10-CM

## 2018-07-29 DIAGNOSIS — M4722 Other spondylosis with radiculopathy, cervical region: Secondary | ICD-10-CM

## 2018-07-29 NOTE — Progress Notes (Signed)
Office Visit Note/orthopedic consultation   Patient: Paul Morales           Date of Birth: 1976-05-31           MRN: [id number] Visit Date: 07/29/2018              Requested by: Curlene Labrum, MD [place], [place] [address] PCP: Curlene Labrum, MD   Assessment & Plan: Visit Diagnoses:  1. Neck pain   2. Other spondylosis with radiculopathy, cervical region     Plan: We will proceed with cervical MRI scan evaluating for progression of his multilevel disc protrusions and foraminal stenosis.  Office follow-up after scan for review.  No changes were made in his medication.  Pathophysiology discussed and previous MRI results from 2014 was reviewed with the patient today.  Thank you for the opportunity to see him in consultation  Follow-Up Instructions: No follow-ups on file.   Orders:  Orders Placed This Encounter  Procedures  . MR Cervical Spine w/o contrast   No orders of the defined types were placed in this encounter.     Procedures: No procedures performed   Clinical Data: No additional findings.   Subjective: Chief Complaint  Patient presents with  . Neck - Pain  . Right Shoulder - Pain  . Left Shoulder - Pain    HPI orthopedic consultation from Dr. Pleas Koch in  this 43 year old male seen with neck pain left shoulder pain more than right shoulder pain.  History of disc protrusions 2014 with epidural by Dr. Francesco Runner.  Patient's had tingling and weakness in his left hand states his shoulder pops with range of motion and states he is not able to turn his head.  He has been treated with prednisone Dosepak Flexeril which helps some.  He has been out of work since December 27.  Patient has IBS, Crohn's disease and is concerned that some of this may be related to [facility] that he is been getting monthly.  A lot of his pain is constant at times with rotation of his neck he states it severe.MRI scan 2014 cervical showed multilevel disc protrusions both right and left  with changes from C2  Down to C7  Review of Systems review of systems positive for fatigue positive Crohn's disease under treatment.  Positive for cervical spondylosis.  Chronic neck and thoracic pain.   Objective: Vital Signs: BP 120/80   Pulse 84   Ht 5' 5"  (1.651 m)   Wt 126 lb (57.2 kg)   BMI 20.97 kg/m   Physical Exam Constitutional:      Appearance: He is well-developed.  HENT:     Head: Normocephalic and atraumatic.  Eyes:     Pupils: Pupils are equal, round, and reactive to light.  Neck:     Thyroid: No thyromegaly.     Trachea: No tracheal deviation.  Cardiovascular:     Rate and Rhythm: Normal rate.  Pulmonary:     Effort: Pulmonary effort is normal.     Breath sounds: No wheezing.  Abdominal:     General: Bowel sounds are normal.     Palpations: Abdomen is soft.  Skin:    General: Skin is warm and dry.     Capillary Refill: Capillary refill takes less than 2 seconds.  Neurological:     Mental Status: He is alert and oriented to person, place, and time.  Psychiatric:        Behavior: Behavior normal.  Thought Content: Thought content normal.        Judgment: Judgment normal.     Ortho Exam patient has 30% rotation of the neck with severe pain.  Severe brachial plexus tenderness worse on the left than right.  3-4+ upper extremity reflexes.  3-4+ knee jerks symmetrical and 2 beat clonus with ankle jerk bilaterally.  Normal heel toe gait no myelopathic gait changes.  With brachial plexus pressure on the left he has some jumping of his index finger.  No impingement of the shoulders no instability of the shoulders or elbows.  Specialty Comments:  No specialty comments available.  Imaging: No results found.   PMFS History: Patient Active Problem List   Diagnosis Date Noted  . Enteritis 12/04/2016  . IBS (irritable bowel syndrome) 04/08/2016   Past Medical History:  Diagnosis Date  . IBS (irritable bowel syndrome) 04/08/2016  . IBS (irritable bowel  syndrome)     Family History  Problem Relation Age of Onset  . Diabetes Mother   . Hypertension Father     Past Surgical History:  Procedure Laterality Date  . COLONOSCOPY N/A 12/22/2016   Procedure: COLONOSCOPY;  Surgeon: Rogene Houston, MD;  [facility];  Service: Endoscopy;  Laterality: N/A;  12:00  . LITHOTRIPSY    . VASECTOMY     Social History   Occupational History  . Not on file  Tobacco Use  . Smoking status: Current Every Day Smoker    Types: Cigars  . Smokeless tobacco: Never Used  Substance and Sexual Activity  . Alcohol use: No    Comment: occasionally   . Drug use: Yes    Types: Marijuana  . Sexual activity: Not on file

## 2018-07-30 DIAGNOSIS — M4722 Other spondylosis with radiculopathy, cervical region: Secondary | ICD-10-CM | POA: Insufficient documentation

## 2018-08-05 ENCOUNTER — Ambulatory Visit (INDEPENDENT_AMBULATORY_CARE_PROVIDER_SITE_OTHER): Payer: Managed Care, Other (non HMO) | Admitting: Orthopaedic Surgery

## 2018-08-05 ENCOUNTER — Encounter (INDEPENDENT_AMBULATORY_CARE_PROVIDER_SITE_OTHER): Payer: Self-pay | Admitting: Orthopaedic Surgery

## 2018-08-05 VITALS — BP 130/84 | HR 92 | Ht 65.0 in | Wt 126.0 lb

## 2018-08-05 DIAGNOSIS — M4802 Spinal stenosis, cervical region: Secondary | ICD-10-CM | POA: Diagnosis not present

## 2018-08-05 NOTE — Progress Notes (Signed)
Office Visit Note   Patient: Adryen Cookson           Date of Birth: 12/07/1975           MRN: [id number] Visit Date: 08/05/2018              Requested by: Curlene Labrum, MD [place], [place] [address] PCP: Curlene Labrum, MD   Assessment & Plan: Visit Diagnoses:  1. Spinal stenosis of cervical region   2. Foraminal stenosis of cervical region     Plan: MRI scan was reviewed with patient..  He has had significant pain and has not gotten better with conservative treatment.  Patient has foraminal stenosis with cord flattening with most severe 2 level involvement.  Plan will be C3-4, C4-5    anterior cervical discectomy and fusion with allograft and plate.  Postoperative collar overnight stay in the hospital discussed.  Collar immobilization for 6 weeks.  Follow-Up Instructions: Preop for cervical fusion  Orders:  No orders of the defined types were placed in this encounter.  No orders of the defined types were placed in this encounter.     Procedures: No procedures performed   Clinical Data: No additional findings.   Subjective: Chief Complaint  Patient presents with  . Neck - Pain, Follow-up    MRI Cervical Spine Review    HPI 43 year old male returns with neck pain left shoulder pain with symptoms dating back to 2014.  Patient's had tingling and weakness in his left hand states his shoulder pops with range of motion and has sharp pain when he turns his head.  He has had muscle relaxants, prednisone Dosepak.  He has some pain in the right upper extremity but most severe pain is on the left over the deltoid region stopping above the elbow.  He has trouble sleeping it wakes him up at night if he turns over.  Review of Systems positive for Crohn's disease under treatment with Entyvio.  Positive for long-term progressive cervical spondylosis.   Objective: Vital Signs: BP 130/84   Pulse 92   Ht 5' 5"  (1.651 m)   Wt 126 lb (57.2 kg)   BMI 20.97 kg/m    Physical Exam Constitutional:      Appearance: He is well-developed.  HENT:     Head: Normocephalic and atraumatic.  Eyes:     Pupils: Pupils are equal, round, and reactive to light.  Neck:     Thyroid: No thyromegaly.     Trachea: No tracheal deviation.  Cardiovascular:     Rate and Rhythm: Normal rate.  Pulmonary:     Effort: Pulmonary effort is normal.     Breath sounds: No wheezing.  Abdominal:     General: Bowel sounds are normal.     Palpations: Abdomen is soft.  Skin:    General: Skin is warm and dry.     Capillary Refill: Capillary refill takes less than 2 seconds.  Neurological:     Mental Status: He is alert and oriented to person, place, and time.  Psychiatric:        Behavior: Behavior normal.        Thought Content: Thought content normal.        Judgment: Judgment normal.     Ortho Exam patient has severe brachial plexus tenderness on the left and withdraws.  Some tenderness on the right.  Severe pain with rotation 2530% of normal to the left.  Heel and toe walking is normal  no lower extremity hyperreflexia no lower extremity weakness.  No impingement of the shoulders.  Left paraspinal tenderness.  Upper extremity reflexes are symmetrical.  Specialty Comments:  No specialty comments available.  Imaging: Cervical MRI scan 08/03/2018  Impression 1.  Left paracentral disc protrusion at C4-5 impinging upon and flattening left hemicord with moderate to severe spinal stenosis moderate bilateral C5 foraminal narrowing. 2.  Broad-based right eccentric disc osteophyte at C3-4 with moderate to severe canal and severe bilateral C4 foraminal stenosis. 3.  Moderate left C6 and C7 foraminal stenosis related to disc bulge and uncovertebral hypertrophy. 4.  Reactive marrow edema about the C3-4 and C4-5 facets as above which could contribute to underlying neck pain. Jeannine Boga MD   PMFS History: Patient Active Problem List   Diagnosis Date Noted  . Other  spondylosis with radiculopathy, cervical region 07/30/2018  . Enteritis 12/04/2016  . IBS (irritable bowel syndrome) 04/08/2016   Past Medical History:  Diagnosis Date  . IBS (irritable bowel syndrome) 04/08/2016  . IBS (irritable bowel syndrome)     Family History  Problem Relation Age of Onset  . Diabetes Mother   . Hypertension Father     Past Surgical History:  Procedure Laterality Date  . COLONOSCOPY N/A 12/22/2016   Procedure: COLONOSCOPY;  Surgeon: Rogene Houston, MD;  [facility];  Service: Endoscopy;  Laterality: N/A;  12:00  . LITHOTRIPSY    . VASECTOMY     Social History   Occupational History  . Not on file  Tobacco Use  . Smoking status: Current Every Day Smoker    Types: Cigars  . Smokeless tobacco: Never Used  Substance and Sexual Activity  . Alcohol use: No    Comment: occasionally   . Drug use: Yes    Types: Marijuana  . Sexual activity: Not on file

## 2018-08-08 ENCOUNTER — Encounter (INDEPENDENT_AMBULATORY_CARE_PROVIDER_SITE_OTHER): Payer: Self-pay | Admitting: Orthopaedic Surgery

## 2018-08-08 DIAGNOSIS — M4802 Spinal stenosis, cervical region: Secondary | ICD-10-CM | POA: Insufficient documentation

## 2018-08-23 ENCOUNTER — Telehealth (INDEPENDENT_AMBULATORY_CARE_PROVIDER_SITE_OTHER): Payer: Self-pay | Admitting: Radiology

## 2018-08-23 NOTE — Telephone Encounter (Signed)
I called and spoke with Boneta Lucks at [facility]. She entered all of the information including fax [phone].  She is pushing request to case manager to fax paperwork to me.     I called patient and advised I would call as soon as I have the paperwork.

## 2018-08-23 NOTE — Telephone Encounter (Signed)
Patient called [place] office upset in regards to paperwork from [place]. He states that he has spoken with Micah Noel who originally faxed the forms to the wrong office (his GI doctor), and Micah Noel was faxing forms to the [place] office. The forms have not been received in [place] or [place]. Number to call [place] for forms is [phone]. Patient requests return call after we speak with someone in regards to forms.   CB for patient is [phone]

## 2018-08-24 NOTE — Telephone Encounter (Signed)
Faxed in today, received additional paperwork, will get this filled out as soon as possible and sent in.

## 2018-08-24 NOTE — Telephone Encounter (Signed)
Paperwork to [place] to send in. She will call patient. Forwarding to you for documentation purposes.

## 2018-08-25 NOTE — Telephone Encounter (Signed)
Second form faxed in now as well.

## 2018-09-17 ENCOUNTER — Telehealth (INDEPENDENT_AMBULATORY_CARE_PROVIDER_SITE_OTHER): Payer: Self-pay | Admitting: Orthopaedic Surgery

## 2018-09-17 NOTE — Telephone Encounter (Signed)
noted 

## 2018-09-17 NOTE — Telephone Encounter (Signed)
Left message on voicemail  [phone] for patient to call so that a date for C-spine surgery can be set

## 2018-09-17 NOTE — Telephone Encounter (Signed)
FYI:  Patient scheduled for 2 level c-spine fusion on 09-27-18 at [facility]. H&P with Benjiman Core PA on 09-23-18.  Medical clearance faxed to Dr. Eduard Roux. Post op visit set for 10-05-18.

## 2018-09-21 NOTE — Pre-Procedure Instructions (Signed)
[place]  09/21/2018      [facility] [address], [place] - [address] [place] [address] Phone: [phone] Fax: [phone]  [facility] [place] [place] - [address] [place] [address] Phone: [phone] Fax: [phone]    Your procedure is scheduled on March 23rd, 2020.  Report to [facility] Admitting at 10:30 A.M.  Call this number if you have problems the morning of surgery:  [phone]   Remember:  Do not eat or drink after midnight. This includes gum and hard candy.   Take these medicines the morning of surgery with A SIP OF WATER: NONE    Do not wear jewelry, make-up or nail polish.  Do not wear lotions, powders, or perfumes, or deodorant.  Do not shave 48 hours prior to surgery.  Men may shave face and neck.  Do not bring valuables to the hospital.  [facility] is not responsible for any belongings or valuables.  Contacts, dentures or bridgework may not be worn into surgery.  Leave your suitcase in the car.  After surgery it may be brought to your room.  For patients admitted to the hospital, discharge time will be determined by your treatment team.  Patients discharged the day of surgery will not be allowed to drive home.    [place]- Preparing For Surgery  Before surgery, you can play an important role. Because skin is not sterile, your skin needs to be as free of germs as possible. You can reduce the number of germs on your skin by washing with CHG (chlorahexidine gluconate) Soap before surgery.  CHG is an antiseptic cleaner which kills germs and bonds with the skin to continue killing germs even after washing.    Oral Hygiene is also important to reduce your risk of infection.  Remember - BRUSH YOUR TEETH THE MORNING OF SURGERY WITH YOUR REGULAR TOOTHPASTE  Please do not use if you have an allergy to CHG or antibacterial soaps. If your skin becomes reddened/irritated stop using the  CHG.  Do not shave (including legs and underarms) for at least 48 hours prior to first CHG shower. It is OK to shave your face.  Please follow these instructions carefully.   1. Shower the NIGHT BEFORE SURGERY and the MORNING OF SURGERY with CHG.   2. If you chose to wash your hair, wash your hair first as usual with your normal shampoo.  3. After you shampoo, rinse your hair and body thoroughly to remove the shampoo.  4. Use CHG as you would any other liquid soap. You can apply CHG directly to the skin and wash gently with a scrungie or a clean washcloth.   5. Apply the CHG Soap to your body ONLY FROM THE NECK DOWN.  Do not use on open wounds or open sores. Avoid contact with your eyes, ears, mouth and genitals (private parts). Wash Face and genitals (private parts)  with your normal soap.  6. Wash thoroughly, paying special attention to the area where your surgery will be performed.  7. Thoroughly rinse your body with warm water from the neck down.  8. DO NOT shower/wash with your normal soap after using and rinsing off the CHG Soap.  9. Pat yourself dry with a CLEAN TOWEL.  10. Wear CLEAN PAJAMAS to bed the night before surgery, wear comfortable clothes the morning of surgery  11. Place CLEAN SHEETS on your  bed the night of your first shower and DO NOT SLEEP WITH PETS.    Day of Surgery:  Do not apply any deodorants/lotions.  Please wear clean clothes to the hospital/surgery center.   Remember to brush your teeth WITH YOUR REGULAR TOOTHPASTE.

## 2018-09-22 ENCOUNTER — Inpatient Hospital Stay (HOSPITAL_COMMUNITY)
Admission: RE | Admit: 2018-09-22 | Discharge: 2018-09-22 | Disposition: A | Discharge status: Home / Self Care | Payer: Self-pay | Source: Ambulatory Visit

## 2018-09-23 ENCOUNTER — Ambulatory Visit (INDEPENDENT_AMBULATORY_CARE_PROVIDER_SITE_OTHER): Payer: Managed Care, Other (non HMO) | Admitting: Surgery

## 2018-09-24 ENCOUNTER — Inpatient Hospital Stay (HOSPITAL_COMMUNITY): Admission: RE | Admit: 2018-09-24 | Payer: Self-pay | Source: Ambulatory Visit

## 2018-09-27 ENCOUNTER — Encounter (HOSPITAL_COMMUNITY): Admission: RE | Payer: Self-pay | Source: Home / Self Care

## 2018-09-27 ENCOUNTER — Ambulatory Visit (HOSPITAL_COMMUNITY): Admission: RE | Admit: 2018-09-27 | Payer: 59 | Source: Home / Self Care | Admitting: Orthopaedic Surgery

## 2018-09-27 SURGERY — ANTERIOR CERVICAL DECOMPRESSION/DISCECTOMY FUSION 2 LEVELS
Anesthesia: General

## 2018-09-30 ENCOUNTER — Telehealth (INDEPENDENT_AMBULATORY_CARE_PROVIDER_SITE_OTHER): Payer: Self-pay | Admitting: Orthopaedic Surgery

## 2018-09-30 NOTE — Telephone Encounter (Signed)
Patient called asked if a note can be sent to [facility] stating that his surgery was postponed due to the Coronavirus so that he can receive his short term disability funds. The fax number to Christella Scheuermann is [phone]   Attn: [place]    The number to contact patient is [phone]

## 2018-09-30 NOTE — Telephone Encounter (Signed)
Ok for letter

## 2018-09-30 NOTE — Telephone Encounter (Signed)
OK - thanks

## 2018-10-04 NOTE — Telephone Encounter (Signed)
Letter faxed per patient request. I left voicemail for patient advising.

## 2018-10-05 ENCOUNTER — Inpatient Hospital Stay (INDEPENDENT_AMBULATORY_CARE_PROVIDER_SITE_OTHER): Payer: Managed Care, Other (non HMO) | Admitting: Orthopaedic Surgery

## 2018-10-19 ENCOUNTER — Telehealth (INDEPENDENT_AMBULATORY_CARE_PROVIDER_SITE_OTHER): Payer: Self-pay | Admitting: Orthopaedic Surgery

## 2018-10-19 NOTE — Telephone Encounter (Signed)
Needs another letter regarding his surgery is delayed due to covid-19 sent to [facility]. pts callback [phone]

## 2018-10-20 NOTE — Telephone Encounter (Signed)
Faxed per patient request

## 2018-10-21 ENCOUNTER — Telehealth (INDEPENDENT_AMBULATORY_CARE_PROVIDER_SITE_OTHER): Payer: Self-pay | Admitting: Radiology

## 2018-10-21 ENCOUNTER — Telehealth (INDEPENDENT_AMBULATORY_CARE_PROVIDER_SITE_OTHER): Payer: Self-pay | Admitting: Orthopedic Surgery

## 2018-10-21 MED ORDER — TRAMADOL HCL 50 MG PO TABS: 50.0000 mg | ORAL_TABLET | Freq: Two times a day (BID) | ORAL | 0 refills | Status: DC | PRN

## 2018-10-21 NOTE — Addendum Note (Signed)
Addended by: Rogers Seeds on: 10/21/2018 09:42 AM   Modules accepted: Orders

## 2018-10-21 NOTE — Telephone Encounter (Signed)
Ultram # [id number]  One po bid  Prn pain. thanks

## 2018-10-21 NOTE — Telephone Encounter (Signed)
Medication called to pharmacy. I called patient and advised. 

## 2018-10-21 NOTE — Telephone Encounter (Signed)
Mr. Paul Morales would like to speak with [facility].  He states that he is pending neck surgery due to COVID-19 and he needs a note sent to his disability case worker stating that it is still pending due to the pandemic.  Case worker's name is Paul Morales, Mr. Paul Morales states Paul Morales has all the information she needs to contact him.

## 2018-10-21 NOTE — Telephone Encounter (Signed)
Patient would like to know if something can be called in for his neck pain. He uses the [facility] in [place].   Patient is awaiting cervical spinal fusion, surgery was cancelled due to COVID-19.  CB for patient is [phone]

## 2018-10-21 NOTE — Telephone Encounter (Signed)
Note has been faxed to [facility]. I called patient and advised.

## 2018-10-27 ENCOUNTER — Telehealth (INDEPENDENT_AMBULATORY_CARE_PROVIDER_SITE_OTHER): Payer: Self-pay | Admitting: Orthopaedic Surgery

## 2018-10-27 NOTE — Telephone Encounter (Signed)
Faxed to Attn: Micah Noel [phone]

## 2018-10-27 NOTE — Telephone Encounter (Signed)
Patient requesting another note be sent to his disability case worker stating that surgery is pending due to Covid-19.  The note will again go to the attention of Lance.

## 2018-11-15 ENCOUNTER — Telehealth: Payer: Self-pay | Admitting: Orthopaedic Surgery

## 2018-11-15 NOTE — Telephone Encounter (Signed)
Letter entered into system and faxed Attn: Micah Noel [phone] per patient request.

## 2018-11-15 NOTE — Telephone Encounter (Signed)
Pt called asking if he can have a note about his surgery being pushed back due to [place]

## 2018-11-22 ENCOUNTER — Telehealth: Payer: Self-pay | Admitting: Orthopaedic Surgery

## 2018-11-22 NOTE — Telephone Encounter (Signed)
Please advise 

## 2018-11-22 NOTE — Telephone Encounter (Signed)
Patient is confused as to whether to take the infusion or not prior to surgery.  Please call patient to advise.  [phone]

## 2018-11-23 NOTE — Telephone Encounter (Signed)
I called , humira stop it 2 wks before surgery. Surgery 6/5 so he will skip dose. FYI

## 2018-11-23 NOTE — Telephone Encounter (Signed)
noted 

## 2019-07-27 ENCOUNTER — Telehealth: Payer: Self-pay | Admitting: Orthopaedic Surgery

## 2019-07-27 NOTE — Telephone Encounter (Signed)
Patient called, needs records sent to [facility] for his LTD. We have faxed previously, I faxed to different number provided by the patient. Faxed [phone], attn: Marti Sleigh.

## 2019-09-01 ENCOUNTER — Encounter: Payer: Self-pay | Admitting: Orthopaedic Surgery

## 2019-09-01 ENCOUNTER — Ambulatory Visit (INDEPENDENT_AMBULATORY_CARE_PROVIDER_SITE_OTHER): Payer: 59 | Admitting: Orthopaedic Surgery

## 2019-09-01 VITALS — BP 120/95 | HR 87 | Ht 65.0 in | Wt 117.0 lb

## 2019-09-01 DIAGNOSIS — M4802 Spinal stenosis, cervical region: Secondary | ICD-10-CM | POA: Diagnosis not present

## 2019-09-01 DIAGNOSIS — M4722 Other spondylosis with radiculopathy, cervical region: Secondary | ICD-10-CM

## 2019-09-01 MED ORDER — DIAZEPAM 5 MG PO TABS: ORAL_TABLET | ORAL | 0 refills | Status: DC

## 2019-09-01 NOTE — Progress Notes (Signed)
Office Visit Note   Patient: Paul Morales           Date of Birth: 04-Aug-1975           MRN: [id number] Visit Date: 09/01/2019              Requested by: Curlene Labrum, MD [place],  [place] [id number] PCP: Curlene Labrum, MD   Assessment & Plan: Visit Diagnoses:  1. Spinal stenosis of cervical region   2. Other spondylosis with radiculopathy, cervical region   3. Foraminal stenosis of cervical region     Plan: Patient is a new cervical MRI has been 1 year since his previous imaging study in February 2020.  He had cord compression and still likely need surgery due to his ongoing persistent symptoms and has not been able to resume work until the scan is obtained and the surgery is performed.  I can call him with the results of the MRI imaging.  Patient remains out of work until 7 weeks after surgery.  Pathophysiology discussed indication of surgery discussed risk surgery discussed and reviewed again.  Follow-Up Instructions: No follow-ups on file.   Orders:  Orders Placed This Encounter  Procedures  . MR Cervical Spine w/o contrast   Meds ordered this encounter  Medications  . diazepam (VALIUM) 5 MG tablet    Sig: Take as directed prior to procedure.    Dispense:  2 tablet    Refill:  0      Procedures: No procedures performed   Clinical Data: No additional findings.   Subjective: Chief Complaint  Patient presents with  . Neck - Pain, Follow-up    HPI 44 year old male returns for cervical spondylosis with cervical stenosis and hemicord compression and has been out of work since December 2019.  He has been out of work over a year he was originally scheduled for surgery in March 2020 but this was canceled due to Covid.  He states he had a daughter that had Covid he was tested and retested and he has remained negative.  His job involves lifting turning and twisting.  Patient has last been seen by me on 08/05/2018 and at that time we discussed two-level  cervical fusion.  He was originally scheduled for surgery on 09/27/2018 and this was canceled due to Covid with elective schedule operating room shutdown.  He continues to have neck pain pain that radiates into left arm.  Patient has Crohn's disease and he was on Entyvio in the past this was stopped when he had significant increase in his neck and arm pain.  Patient states he supposed to be starting Humira within the next week.  This is prescribed by Dr. Eduard Roux.  Review of Systems Positive for Crohn's disease symptoms.  Positive cervical spondylosis hemicord compression chronic neck and left arm pain.  Negative for chest pain negative for previous stroke.  Objective: Vital Signs: BP (!) 120/95   Pulse 87   Ht 5' 5"  (1.651 m)   Wt 117 lb (53.1 kg)   BMI 19.47 kg/m   Physical Exam Constitutional:      Appearance: He is well-developed.  HENT:     Head: Normocephalic and atraumatic.  Eyes:     Pupils: Pupils are equal, round, and reactive to light.  Neck:     Thyroid: No thyromegaly.     Trachea: No tracheal deviation.  Cardiovascular:     Rate and Rhythm: Normal rate.  Pulmonary:  Effort: Pulmonary effort is normal.     Breath sounds: No wheezing.  Abdominal:     General: Bowel sounds are normal.     Palpations: Abdomen is soft.  Skin:    General: Skin is warm and dry.     Capillary Refill: Capillary refill takes less than 2 seconds.  Neurological:     Mental Status: He is alert and oriented to person, place, and time.  Psychiatric:        Behavior: Behavior normal.        Thought Content: Thought content normal.        Judgment: Judgment normal.     Ortho Exam positive brachial plexus tenderness on the left.  Upper extremity reflexes are 3+ and symmetrical.  Patient has to be clonus lower extremities with ankle jerk.  Negative impingement the shoulders.  Specialty Comments:  No specialty comments available.  Imaging: No results found.   PMFS  History: Patient Active Problem List   Diagnosis Date Noted  . Spinal stenosis of cervical region 08/08/2018  . 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 Enteritis 12/04/2016  . IBS (irritable bowel syndrome) 04/08/2016   Past Medical History:  Diagnosis Date  . IBS (irritable bowel syndrome) 04/08/2016  . IBS (irritable bowel syndrome)     Family History  Problem Relation Age of Onset  . Diabetes Mother   . Hypertension Father     Past Surgical History:  Procedure Laterality Date  . COLONOSCOPY N/A 12/22/2016   Procedure: COLONOSCOPY;  Surgeon: Rogene Houston, MD;  Location: [facility];  Service: Endoscopy;  Laterality: N/A;  12:00  . LITHOTRIPSY    . VASECTOMY     Social History   Occupational History  . Not on file  Tobacco Use  . Smoking status: Current Every Day Smoker    Types: Cigars  . Smokeless tobacco: Never Used  Substance and Sexual Activity  . Alcohol use: No    Comment: occasionally   . Drug use: Yes    Types: Marijuana  . Sexual activity: Not on file

## 2019-10-27 ENCOUNTER — Telehealth: Payer: Self-pay | Admitting: Radiology

## 2019-10-27 MED ORDER — DIAZEPAM 5 MG PO TABS: ORAL_TABLET | ORAL | 0 refills | Status: DC

## 2019-10-27 NOTE — Telephone Encounter (Signed)
Approval received for MRI. Patient is claustrophobic and needs rx called in to [facility] in [place].  Valium #2 called to pharmacy.

## 2019-11-03 ENCOUNTER — Encounter: Payer: Self-pay | Admitting: Orthopaedic Surgery

## 2019-11-10 ENCOUNTER — Ambulatory Visit (INDEPENDENT_AMBULATORY_CARE_PROVIDER_SITE_OTHER): Payer: PRIVATE HEALTH INSURANCE | Admitting: Orthopaedic Surgery

## 2019-11-10 ENCOUNTER — Encounter: Payer: Self-pay | Admitting: Orthopaedic Surgery

## 2019-11-10 ENCOUNTER — Other Ambulatory Visit: Payer: Self-pay

## 2019-11-10 VITALS — Ht 65.0 in | Wt 117.0 lb

## 2019-11-10 DIAGNOSIS — M4722 Other spondylosis with radiculopathy, cervical region: Secondary | ICD-10-CM

## 2019-11-10 DIAGNOSIS — M4802 Spinal stenosis, cervical region: Secondary | ICD-10-CM | POA: Diagnosis not present

## 2019-11-10 NOTE — Progress Notes (Signed)
Office Visit Note   Patient: Paul Morales           Date of Birth: 04-30-1976           MRN: [id number] Visit Date: 11/10/2019              Requested by: Curlene Labrum, MD [place],  [place] [id number] PCP: Curlene Labrum, MD   Assessment & Plan: Visit Diagnoses:  1. Foraminal stenosis of cervical region   2. Other spondylosis with radiculopathy, cervical region   3. Spinal stenosis of cervical region     Plan: Patient will need repeat MRI scan since his last scan was January 2020.  He will return after the scan and then we will schedule him for surgery.  Follow-Up Instructions: return after  Cervical MRI.   Orders:  No orders of the defined types were placed in this encounter.  No orders of the defined types were placed in this encounter.     Procedures: No procedures performed   Clinical Data: No additional findings.   Subjective: Chief Complaint  Patient presents with  . Neck - Follow-up, Pain    MRI review    HPI 44 year old male returns and was scheduled for C3-4 and C4-5 anterior cervical discectomy and fusion for severe stenosis with hemicord pressure and severe foraminal stenosis.  His surgery was canceled due to Covid and now he returns with persistent symptoms and states he is ready to reschedule surgery.  Scan was done at [facility] in January 2020.  Patient continues to have persistent pain in his neck and his arms with more pain on the left than the right and he had the left hemicord compression .  Review of Systems Positive for Crohn's disease on Entyvio.  Positive for longstanding cervical spondylosis with stenosis and severe foraminal stenosis C3-4 C4-5 and mild changes at C6-7.  Objective: Vital Signs: Ht 5' 5"  (1.651 m)   Wt 117 lb (53.1 kg)   BMI 19.47 kg/m   Physical Exam Constitutional:      Appearance: He is well-developed.  HENT:     Head: Normocephalic and atraumatic.  Eyes:     Pupils: Pupils are equal,  round, and reactive to light.  Neck:     Thyroid: No thyromegaly.     Trachea: No tracheal deviation.  Cardiovascular:     Rate and Rhythm: Normal rate.  Pulmonary:     Effort: Pulmonary effort is normal.     Breath sounds: No wheezing.  Abdominal:     General: Bowel sounds are normal.     Palpations: Abdomen is soft.  Skin:    General: Skin is warm and dry.     Capillary Refill: Capillary refill takes less than 2 seconds.  Neurological:     Mental Status: He is alert and oriented to person, place, and time.  Psychiatric:        Behavior: Behavior normal.        Thought Content: Thought content normal.        Judgment: Judgment normal.     Ortho Exam patient has severe pain with neck compression and left brachial plexus tenderness.  Mild to moderate tenderness on the right.  He only has 30% rotation left with sharp severe pain.  No lower extremity hyperreflexia no clonus.  He does have left paraspinal tenderness.  Upper extremity reflexes are symmetrical.  Specialty Comments:  No specialty comments available.  Imaging: No results found.  PMFS History: Patient Active Problem List   Diagnosis Date Noted  . Spinal stenosis of cervical region 08/08/2018  . 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 Enteritis 12/04/2016  . IBS (irritable bowel syndrome) 04/08/2016   Past Medical History:  Diagnosis Date  . IBS (irritable bowel syndrome) 04/08/2016  . IBS (irritable bowel syndrome)     Family History  Problem Relation Age of Onset  . Diabetes Mother   . Hypertension Father     Past Surgical History:  Procedure Laterality Date  . COLONOSCOPY N/A 12/22/2016   Procedure: COLONOSCOPY;  Surgeon: Rogene Houston, MD;  Location: [facility];  Service: Endoscopy;  Laterality: N/A;  12:00  . LITHOTRIPSY    . VASECTOMY     Social History   Occupational History  . Not on file  Tobacco Use  .  Smoking status: Current Every Day Smoker    Types: Cigars  . Smokeless tobacco: Never Used  Substance and Sexual Activity  . Alcohol use: No    Comment: occasionally   . Drug use: Yes    Types: Marijuana  . Sexual activity: Not on file

## 2019-11-15 ENCOUNTER — Telehealth: Payer: Self-pay

## 2019-11-15 NOTE — Telephone Encounter (Signed)
I tried calling patient. No answer. LMVM for him to call me back so I can advise per Dr Lorin Mercy note below.

## 2019-11-15 NOTE — Telephone Encounter (Signed)
No change in MRI, proceed with scheduling 2 level fusion same as previous blue sheet Globus plate, let pt know MRI is the same . thanks

## 2019-11-15 NOTE — Telephone Encounter (Signed)
Gwinda Passe had mentioned to [place] last week about patient being scheduled for surgery but we were under the impression that he still had MRI scan pending.  Patient had appointment with you 05/06 in [place] clinic to review scan but per dictation it was communicated that patient had not had cervical MRI yet and was to follow up with you after scan complete.  I looked into this and patient actually did have the MRI scan done at [facility] in [place] on 11/03/19.  How would you like to proceed? Do you want to call patient with results? Does Jackelyn Poling still need to reach out to patient to schedule surgery?

## 2019-11-21 NOTE — Telephone Encounter (Signed)
Dr. Lorin Mercy reviewed MRI with patient in [place] office. Paul Morales is in process of surgery scheduling.

## 2019-12-08 ENCOUNTER — Ambulatory Visit (INDEPENDENT_AMBULATORY_CARE_PROVIDER_SITE_OTHER): Payer: 59 | Admitting: Surgery

## 2019-12-08 VITALS — BP 133/85 | HR 96

## 2019-12-08 DIAGNOSIS — M4722 Other spondylosis with radiculopathy, cervical region: Secondary | ICD-10-CM

## 2019-12-09 NOTE — Progress Notes (Signed)
Your procedure is scheduled on Wednesday June 16.  Report to [facility] Main Entrance "A" at 10:30 A.M., and check in at the Admitting office.  Call this number if you have problems the morning of surgery: [phone]  Call [phone] if you have any questions prior to your surgery date Monday-Friday 8am-4pm   Remember: Do not eat or drink after midnight the night before your surgery  Take these medicines the morning of surgery with A SIP OF WATER: dicyclomine (BENTYL) sulfaSALAzine (AZULFIDINE)   If needed:  cyclobenzaprine (FLEXERIL)    7 days prior to surgery STOP taking any Aspirin (unless otherwise instructed by your surgeon), Aleve, Naproxen, Ibuprofen, Motrin, Advil, Goody's, BC's, all herbal medications, fish oil, and all vitamins.    The Morning of Surgery  Do not wear jewelry  Do not wear lotions, powders, or perfumes/colognes, or deodorant Men may shave face and neck.  Do not bring valuables to the hospital.  [facility] is not responsible for any belongings or valuables.  If you are a smoker, DO NOT Smoke 24 hours prior to surgery  If you wear a CPAP at night please bring your mask the morning of surgery   Remember that you must have someone to transport you home after your surgery, and remain with you for 24 hours if you are discharged the same day.   Please bring cases for contacts, glasses, hearing aids, dentures or bridgework because it cannot be worn into surgery.    Leave your suitcase in the car.  After surgery it may be brought to your room.  For patients admitted to the hospital, discharge time will be determined by your treatment team.  Patients discharged the day of surgery will not be allowed to drive home.    Special instructions:   [facility]- Preparing For Surgery  Before surgery, you can play an important role. Because skin is not sterile, your skin needs to be as free of germs as possible. You can reduce the number of germs on your  skin by washing with CHG (chlorahexidine gluconate) Soap before surgery.  CHG is an antiseptic cleaner which kills germs and bonds with the skin to continue killing germs even after washing.    Oral Hygiene is also important to reduce your risk of infection.  Remember - BRUSH YOUR TEETH THE MORNING OF SURGERY WITH YOUR REGULAR TOOTHPASTE  Please do not use if you have an allergy to CHG or antibacterial soaps. If your skin becomes reddened/irritated stop using the CHG.  Do not shave (including legs and underarms) for at least 48 hours prior to first CHG shower. It is OK to shave your face.  Please follow these instructions carefully.   1. Shower the NIGHT BEFORE SURGERY and the MORNING OF SURGERY with CHG Soap.   2. If you chose to wash your hair and body, wash as usual with your normal shampoo and body-wash/soap.  3. Rinse your hair and body thoroughly to remove the shampoo and soap.  4. Apply CHG directly to the skin (ONLY FROM THE NECK DOWN) and wash gently with a scrungie or a clean washcloth.   5. Do not use on open wounds or open sores. Avoid contact with your eyes, ears, mouth and genitals (private parts). Wash Face and genitals (private parts)  with your normal soap.   6. Wash thoroughly, paying special attention to the area where your surgery will be performed.  7. Thoroughly rinse your body with warm water from the neck  down.  8. DO NOT shower/wash with your normal soap after using and rinsing off the CHG Soap.  9. Pat yourself dry with a CLEAN TOWEL.  10. Wear CLEAN PAJAMAS to bed the night before surgery  11. Place CLEAN SHEETS on your bed the night of your first shower and DO NOT SLEEP WITH PETS.  12. Wear comfortable clothes the morning of surgery.     Day of Surgery:  Please shower the morning of surgery with the CHG soap Do not apply any deodorants/lotions. Please wear clean clothes to the hospital/surgery center.   Remember to brush your teeth WITH YOUR REGULAR  TOOTHPASTE.   Please read over the following fact sheets that you were given.

## 2019-12-12 ENCOUNTER — Encounter (HOSPITAL_COMMUNITY): Payer: Self-pay | Admitting: Physician Assistant

## 2019-12-12 ENCOUNTER — Encounter (HOSPITAL_COMMUNITY): Payer: Self-pay

## 2019-12-12 ENCOUNTER — Telehealth: Payer: Self-pay | Admitting: Orthopaedic Surgery

## 2019-12-12 ENCOUNTER — Other Ambulatory Visit: Payer: Self-pay

## 2019-12-12 ENCOUNTER — Encounter (HOSPITAL_COMMUNITY)
Admission: RE | Admit: 2019-12-12 | Discharge: 2019-12-12 | Disposition: A | Discharge status: Home / Self Care | Payer: PRIVATE HEALTH INSURANCE | Source: Ambulatory Visit | Attending: Orthopaedic Surgery | Admitting: Orthopaedic Surgery

## 2019-12-12 DIAGNOSIS — Z01812 Encounter for preprocedural laboratory examination: Secondary | ICD-10-CM | POA: Diagnosis present

## 2019-12-12 HISTORY — DX: Crohn's disease, unspecified, without complications: K50.90

## 2019-12-12 LAB — COMPREHENSIVE METABOLIC PANEL
ALT: 10 U/L (ref 0–44)
AST: 13 U/L — ABNORMAL LOW (ref 15–41)
Albumin: 2.9 g/dL — ABNORMAL LOW (ref 3.5–5.0)
Alkaline Phosphatase: 70 U/L (ref 38–126)
Anion gap: 8 (ref 5–15)
BUN: 7 mg/dL (ref 6–20)
CO2: 25 mmol/L (ref 22–32)
Calcium: 8.1 mg/dL — ABNORMAL LOW (ref 8.9–10.3)
Chloride: 108 mmol/L (ref 98–111)
Creatinine, Ser: 0.75 mg/dL (ref 0.61–1.24)
GFR calc Af Amer: >60 mL/min (ref >60)
GFR calc non Af Amer: >60 mL/min (ref >60)
Glucose, Bld: 103 mg/dL — ABNORMAL HIGH (ref 70–99)
Potassium: 3 mmol/L — ABNORMAL LOW (ref 3.5–5.1)
Sodium: 141 mmol/L (ref 135–145)
Total Bilirubin: 0.4 mg/dL (ref 0.3–1.2)
Total Protein: 6 g/dL — ABNORMAL LOW (ref 6.5–8.1)

## 2019-12-12 LAB — CBC
HCT: 40.6 % (ref 39.0–52.0)
Hemoglobin: 12.8 g/dL — ABNORMAL LOW (ref 13.0–17.0)
MCH: 29 pg (ref 26.0–34.0)
MCHC: 31.5 g/dL (ref 30.0–36.0)
MCV: 92.1 fL (ref 80.0–100.0)
Platelets: 406 10*3/uL — ABNORMAL HIGH (ref 150–400)
RBC: 4.41 MIL/uL (ref 4.22–5.81)
RDW: 15.4 % (ref 11.5–15.5)
WBC: 8.5 10*3/uL (ref 4.0–10.5)
nRBC: 0 % (ref 0.0–0.2)

## 2019-12-12 LAB — SURGICAL PCR SCREEN
MRSA, PCR: NEGATIVE
Staphylococcus aureus: POSITIVE — AB

## 2019-12-12 NOTE — Telephone Encounter (Signed)
Please advise 

## 2019-12-12 NOTE — Telephone Encounter (Signed)
Pt called wanting to know if he needed to stop any medications before surgery.  [phone]

## 2019-12-12 NOTE — Progress Notes (Signed)
PCP - Dr. Saunders Glance Cardiologist - patient denies  PPM/ICD - n/a Device Orders -  Rep Notified -   Chest x-ray - n/a EKG - n/a Stress Test - patient denies ECHO - patient denies Cardiac Cath - patient denies  Sleep Study - patient denies CPAP -   Fasting Blood Sugar - n/a Checks Blood Sugar _____ times a day  Blood Thinner Instructions: n/a Aspirin Instructions: n/a Patient was instructed to contact Dr. Lorin Mercy' office regarding his sulfasalazine (antiinflammatory) and if he should stop it.  ERAS Protcol - no orders PRE-SURGERY Ensure or G2-   COVID TEST- 12/17/19    Anesthesia review:  n/a  Patient denies shortness of breath, fever, cough and chest pain at PAT appointment   All instructions explained to the patient, with a verbal understanding of the material. Patient agrees to go over the instructions while at home for a better understanding. Patient also instructed to self quarantine after being tested for COVID-19. The opportunity to ask questions was provided.

## 2019-12-13 ENCOUNTER — Encounter (HOSPITAL_COMMUNITY): Payer: Self-pay

## 2019-12-13 NOTE — Progress Notes (Addendum)
Anesthesia Chart Review:  Case: [id number] Date/Time: 12/21/19 1215   Procedure: c3-4, c4-5 ANTERIOR CERVICAL DECOMPRESSION/DISCECTOMY FUSION, ALLOGRAFT, PLATE (N/A )   Anesthesia type: General   Pre-op diagnosis: c3-4, c4-5 stenosis   Location: [facility] OR ROOM 03 / [facility]   Surgeons: Marybelle Killings, MD      DISCUSSION: Patient is a 44 year old male scheduled for the above procedure.  History includes smoking (about 5 cigars/day), IBS, ileocolonic Crohn's disease (diagnosed 12/2016).   He reported recent preoperative evaluation at [facility]. Medical clearance letter signed by Clemmie Krill, PA-C.  Records requested, but pending. He denied chest pain, SOB, palpitations, syncope. He has been unable to work due to [employer] disease since 06/2018. He reported a remote history of an EKG, but denied prior cardiac testing. Reported his BP and HR were elevated at PAT because he got the "run-around" with parking and had to run move his car. (UPDATE 12/15/19 9:33 AM: 12/06/19 preoperative evaluation visit received from [place], [place]. BP 116/62 and HR 88 bpm. No EKG on file there.)  He reported that Humira is due 12/14/19; however, per Dr. Lorin Mercy hold 12/14/19 Humira dose but okay to continue sulfasalazine--I noitified Mr. Dorr of this and advised he confirm with Dr. Lorin Mercy when to resume Humira post-operatively.    Presurgical COVID-19 test scheduled for 12/17/19. Anesthesia team to evaluate on the day of surgery. Of note, surgeon orders not in at the time of PAT, so if additional orders entered beyond what was done at PAT then those would have to be done on the day of surgery.   ADDENDUM 12/20/19 5:59 PM: Received an update from [place] at Dr. Lorin Mercy' office. Patient had to be re-evaluated by PCP today for report of chest pain to GI. Reportedly now being referred to cardiology and surgery to be postponed.   VS: BP (!) 126/96   Pulse (!) 107   Temp 36.8 C (Oral)   Resp 18   Ht 5' 5"   (1.651 m)   Wt 56.8 kg   SpO2 100%   BMI 20.82 kg/m   HR 96 bpm 12/08/19 and 87 bpm 09/01/19   PROVIDERS: Curlene Labrum, MD is PCP. Sees Clemmie Krill, PA-C ([facility] in [place]).  Eduard Roux, MD is GI ([place]). Last evaluation 09/01/19.    LABS: Labs reviewed: Acceptable for surgery. (all labs ordered are listed, but only abnormal results are displayed)  Labs Reviewed  SURGICAL PCR SCREEN - Abnormal; Notable for the following components:      Result Value   Staphylococcus aureus POSITIVE (*)    All other components within normal limits  COMPREHENSIVE METABOLIC PANEL - Abnormal; Notable for the following components:   Potassium 3.0 (*)    Glucose, Bld 103 (*)    Calcium 8.1 (*)    Total Protein 6.0 (*)    Albumin 2.9 (*)    AST 13 (*)    All other components within normal limits  CBC - Abnormal; Notable for the following components:   Hemoglobin 12.8 (*)    Platelets 406 (*)    All other components within normal limits     EKG: N/A   CV: Denied prior stress, echo, cardiac cath   Past Medical History:  Diagnosis Date  . Crohn's disease ([place])   . IBS (irritable bowel syndrome) 04/08/2016    Past Surgical History:  Procedure Laterality Date  . COLONOSCOPY N/A 12/22/2016   Procedure: COLONOSCOPY;  Surgeon: Rogene Houston, MD;  Location[facility];  Service: Endoscopy;  Laterality: N/A;  12:00  . LITHOTRIPSY    . VASECTOMY      MEDICATIONS: . Adalimumab (HUMIRA) 40 MG/0.4ML PSKT  . Cobalamin Combinations (B-12) [phone] MCG SUBL  . cyclobenzaprine (FLEXERIL) 5 MG tablet  . dicyclomine (BENTYL) 10 MG capsule  . ferrous sulfate 325 (65 FE) MG tablet  . furosemide (LASIX) 40 MG tablet  . loperamide (IMODIUM) 2 MG capsule  . mercaptopurine (PURINETHOL) 50 MG tablet  . potassium chloride (K-DUR,KLOR-CON) 10 MEQ tablet  . predniSONE (DELTASONE) 10 MG tablet  . sulfaSALAzine (AZULFIDINE) 500 MG tablet  . traMADol  (ULTRAM) 50 MG tablet  . vitamin C (ASCORBIC ACID) 250 MG tablet   No current facility-administered medications for this encounter.   Per current med list, he is not taking Lasix 40 mg, Imodium 2 mg, mercaptopurine, prednisone, tramadol.    Myra Gianotti, PA-C Surgical Short Stay/Anesthesiology [facility] Phone [phone] [facility] Phone [phone] 12/14/2019 9:17 AM

## 2019-12-13 NOTE — Telephone Encounter (Signed)
I called patient and advised. 

## 2019-12-13 NOTE — Telephone Encounter (Signed)
Anesthesia services advises him. They will tell him.

## 2019-12-14 NOTE — Anesthesia Preprocedure Evaluation (Deleted)
Anesthesia Evaluation Anesthesia Physical Anesthesia Plan  ASA:   Anesthesia Plan:    Post-op Pain Management:    Induction:   PONV Risk Score and Plan:   Airway Management Planned:   Additional Equipment:   Intra-op Plan:   Post-operative Plan:   Informed Consent:   Plan Discussed with:   Anesthesia Plan Comments: (PAT note written by Myra Gianotti, PA-C. )        Anesthesia Quick Evaluation

## 2019-12-17 ENCOUNTER — Other Ambulatory Visit (HOSPITAL_COMMUNITY)
Admission: RE | Admit: 2019-12-17 | Discharge: 2019-12-17 | Disposition: A | Discharge status: Home / Self Care | Payer: PRIVATE HEALTH INSURANCE | Source: Ambulatory Visit | Attending: Orthopaedic Surgery | Admitting: Orthopaedic Surgery

## 2019-12-17 DIAGNOSIS — Z20822 Contact with and (suspected) exposure to covid-19: Secondary | ICD-10-CM | POA: Diagnosis not present

## 2019-12-17 DIAGNOSIS — Z01812 Encounter for preprocedural laboratory examination: Secondary | ICD-10-CM | POA: Insufficient documentation

## 2019-12-17 LAB — SARS CORONAVIRUS 2 (TAT 6-24 HRS): SARS Coronavirus 2: NEGATIVE

## 2019-12-20 ENCOUNTER — Other Ambulatory Visit: Payer: Self-pay

## 2019-12-20 ENCOUNTER — Telehealth: Payer: Self-pay | Admitting: Surgery

## 2019-12-20 NOTE — Telephone Encounter (Signed)
  Today had a phone conversation with Paul Morales.  He is scheduled for two-level cervical fusion with Dr. Lorin Mercy tomorrow.  I reviewed patient's chart and it looks like he spoke with his GI office yesterday with complaints of left-sided chest pain.  RN from the office advised patient to go to the emergency room immediately for evaluation.  Patient stated to me this morning that he did not go as advised.  I advised him that with the chest pain that is described in the GI note dated yesterday that he needs thisproperly evaluated and we need clearance for this.  I did speak with patient's PCP office who gave preop medical clearance December 08, 2019.  I am awaiting callback from provider to get their input.  I had advised patient that there is a chance that if he does not get this evaluated that surgery could be canceled.

## 2019-12-20 NOTE — Telephone Encounter (Signed)
Spoke with Amy C.at [facility] in reference to an updated clearance for patient.  She will make sure patient's PCP  Tawni Carnes, PA-C receives the request.  At this point Amy is not sure if patient can be seen today and mentions patient may be required to have an EKG. [facility] is aware patient is scheduled tomorrow for the 2 level cervical fusion with Dr. Lorin Mercy at 12:30pm.  Benjiman Core, PA-C is stilling waiting for a return call from patient's PCP.

## 2019-12-21 ENCOUNTER — Encounter (HOSPITAL_COMMUNITY): Admission: RE | Payer: Self-pay | Source: Home / Self Care

## 2019-12-21 ENCOUNTER — Ambulatory Visit (HOSPITAL_COMMUNITY)
Admission: RE | Admit: 2019-12-21 | Payer: PRIVATE HEALTH INSURANCE | Source: Home / Self Care | Admitting: Orthopaedic Surgery

## 2019-12-21 SURGERY — ANTERIOR CERVICAL DECOMPRESSION/DISCECTOMY FUSION 2 LEVELS
Anesthesia: General

## 2019-12-21 NOTE — Telephone Encounter (Signed)
noted 

## 2019-12-23 NOTE — Progress Notes (Signed)
CARDIOLOGY CONSULT NOTE       Patient ID: Paul Morales MRN: [id number] DOB/AGE: 1976/01/27 44 y.o.  Admit date: (Not on file) Referring Physician: Sasser Primary Physician: Curlene Labrum, MD Primary Cardiologist: New Reason for Consultation: Chest pain / Murmur/ Preoperative exam  Active Problems:   * No active hospital problems. *   HPI:  44 y.o. referred by Dr Quintin [facility] for chest pain and murmur He has cervical neck issues and was to have surgery with Dr Lorin Mercy Was seeing GI as well for Crohn's / IBS and complained to them he was having chest pain Intermittent left sided It is actually more pleurisy " it hurts to breath" some radiation to back Pain is sharp ECG done in primary office 12/22/19 normal , sinus , rate 77 bpm   He smokes Sees [name] for his GI issues and is on Humira, prednisone and azulfidine   He has been out of work since December 2019   Labs ok including LFTls WBC Cr 0.63 on 12/16/19 K 3.6 Hct 36.9   His "chest pain" is non cardiac and related to his stiff neck and worse when he lays on left side He was told of murmur as child no scarlet or rheumatic fever   ROS All other systems reviewed and negative except as noted above  Past Medical History:  Diagnosis Date  . Crohn's disease ([place])   . IBS (irritable bowel syndrome) 04/08/2016    Family History  Problem Relation Age of Onset  . Diabetes Mother   . Hypertension Father     Social History   Socioeconomic History  . Marital status: Married    Spouse name: Not on file  . Number of children: Not on file  . Years of education: Not on file  . Highest education level: Not on file  Occupational History  . Not on file  Tobacco Use  . Smoking status: Current Every Day Smoker    Types: Cigars  . Smokeless tobacco: Never Used  Vaping Use  . Vaping Use: Never used  Substance and Sexual Activity  . Alcohol use: No    Comment: occasionally   . Drug use: Yes    Types: Marijuana     Comment: per patient "smoke constantly, everyday of the week"  . Sexual activity: Not on file  Other Topics Concern  . Not on file  Social History Narrative  . Not on file   Social Determinants of Health   Financial Resource Strain:   . Difficulty of Paying Living Expenses:   Food Insecurity:   . Worried About Charity fundraiser in the Last Year:   . Arboriculturist in the Last Year:   Transportation Needs:   . Film/video editor (Medical):   Marland Kitchen Lack of Transportation (Non-Medical):   Physical Activity:   . Days of Exercise per Week:   . Minutes of Exercise per Session:   Stress:   . Feeling of Stress :   Social Connections:   . Frequency of Communication with Friends and Family:   . Frequency of Social Gatherings with Friends and Family:   . Attends Religious Services:   . Active Member of Clubs or Organizations:   . Attends Archivist Meetings:   Marland Kitchen Marital Status:   Intimate Partner Violence:   . Fear of Current or Ex-Partner:   . Emotionally Abused:   Marland Kitchen Physically Abused:   . Sexually Abused:     Past  Surgical History:  Procedure Laterality Date  . COLONOSCOPY N/A 12/22/2016   Procedure: COLONOSCOPY;  Surgeon: Rogene Houston, MD;  Location: [facility];  Service: Endoscopy;  Laterality: N/A;  12:00  . LITHOTRIPSY    . VASECTOMY        Current Outpatient Medications:  .  Adalimumab (HUMIRA) 40 MG/0.4ML PSKT, Inject into the skin every 14 (fourteen) days., Disp: , Rfl:  .  Cobalamin Combinations (B-12) [phone] MCG SUBL, Place 5,000 mcg under the tongue daily. , Disp: , Rfl:  .  dicyclomine (BENTYL) 10 MG capsule, Take 10 mg by mouth 4 (four) times daily -  before meals and at bedtime., Disp: , Rfl:  .  ferrous sulfate 325 (65 FE) MG tablet, Take 325 mg by mouth daily with breakfast. , Disp: , Rfl:  .  vitamin C (ASCORBIC ACID) 250 MG tablet, Take 250 mg by mouth daily., Disp: , Rfl:     Physical Exam: Blood pressure 118/78, pulse 79, height  5' 5"  (1.651 m), weight 123 lb 6.4 oz (56 kg), SpO2 97 %.    Affect appropriate Healthy:  appears stated age 3: normal Neck supple with no adenopathy JVP normal no bruits no thyromegaly Lungs clear with no wheezing and good diaphragmatic motion Heart:  S1/S2 2/6 SEM  , no rub, gallop or click PMI normal Abdomen: benighn, BS positve, no tenderness, no AAA no bruit.  No HSM or HJR Distal pulses intact with no bruits No edema Neuro non-focal Skin warm and dry No muscular weakness   Labs:   Lab Results  Component Value Date   WBC 8.5 12/12/2019   HGB 12.8 (L) 12/12/2019   HCT 40.6 12/12/2019   MCV 92.1 12/12/2019   PLT 406 (H) 12/12/2019      Radiology: No results found.  EKG: See HPI  NSR normal ECG read as LVH due to thin chest    ASSESSMENT AND PLAN:   1. Chest Pain:  Atypical non cardiac no need for stress testing  2  Murmur:  Benign SEM will get echo to further evaluate  3. GI:  Crohn's and IBS f/u [name] see med list above 4. Ortho:  Needs C3-5 anterior cervical decompression / discectomy with Dr Lorin Mercy clear to have this as his murmur appears benign and his chest pain is non cardiac  5. Smoking:  Marijuana and cigars advised to f/u with primary for CXR and should have one as part of his preoperative neck surgery w/u   Signed: Jenkins Rouge 12/27/2019, 1:35 PM

## 2019-12-27 ENCOUNTER — Encounter: Payer: Self-pay | Admitting: Cardiovascular Disease

## 2019-12-27 ENCOUNTER — Ambulatory Visit (INDEPENDENT_AMBULATORY_CARE_PROVIDER_SITE_OTHER): Payer: PRIVATE HEALTH INSURANCE | Admitting: Cardiovascular Disease

## 2019-12-27 ENCOUNTER — Other Ambulatory Visit: Payer: Self-pay

## 2019-12-27 VITALS — BP 118/78 | HR 79 | Ht 65.0 in | Wt 123.4 lb

## 2019-12-27 DIAGNOSIS — R011 Cardiac murmur, unspecified: Secondary | ICD-10-CM

## 2019-12-27 DIAGNOSIS — Z01810 Encounter for preprocedural cardiovascular examination: Secondary | ICD-10-CM

## 2019-12-27 DIAGNOSIS — R079 Chest pain, unspecified: Secondary | ICD-10-CM

## 2019-12-27 NOTE — Patient Instructions (Signed)
Medication Instructions:  Your physician recommends that you continue on your current medications as directed. Please refer to the Current Medication list given to you today.  *If you need a refill on your cardiac medications before your next appointment, please call your pharmacy*   Lab Work: None today If you have labs (blood work) drawn today and your tests are completely normal, you will receive your results only by: Marland Kitchen MyChart Message (if you have MyChart) OR . A paper copy in the mail If you have any lab test that is abnormal or we need to change your treatment, we will call you to review the results.   Testing/Procedures: Your physician recommends that you continue on your current medications as directed. Please refer to the Current Medication list given to you today.    Follow-Up: At [facility], you and your health needs are our priority.  As part of our continuing mission to provide you with exceptional heart care, we have created designated Provider Care Teams.  These Care Teams include your primary Cardiologist (physician) and Advanced Practice Providers (APPs -  Physician Assistants and Nurse Practitioners) who all work together to provide you with the care you need, when you need it.  We recommend signing up for the patient portal called "MyChart".  Sign up information is provided on this After Visit Summary.  MyChart is used to connect with patients for Virtual Visits (Telemedicine).  Patients are able to view lab/test results, encounter notes, upcoming appointments, etc.  Non-urgent messages can be sent to your provider as well.   To learn more about what you can do with MyChart, go to [email or web address].    Your next appointment:  As needed with Dr.Nishan        Thank you for choosing [facility] !

## 2020-01-03 ENCOUNTER — Ambulatory Visit (HOSPITAL_COMMUNITY)
Admission: RE | Admit: 2020-01-03 | Discharge: 2020-01-03 | Disposition: A | Discharge status: Home / Self Care | Payer: PRIVATE HEALTH INSURANCE | Source: Ambulatory Visit | Attending: Cardiovascular Disease | Admitting: Cardiovascular Disease

## 2020-01-03 ENCOUNTER — Other Ambulatory Visit: Payer: Self-pay

## 2020-01-03 DIAGNOSIS — R011 Cardiac murmur, unspecified: Secondary | ICD-10-CM

## 2020-01-03 NOTE — Progress Notes (Signed)
*  PRELIMINARY RESULTS* Echocardiogram 2D Echocardiogram has been performed.  Paul Morales 01/03/2020, 9:10 AM

## 2020-01-05 ENCOUNTER — Telehealth: Payer: Self-pay | Admitting: *Deleted

## 2020-01-05 NOTE — Telephone Encounter (Signed)
° °  [facility] Pre-operative Risk Assessment    HEARTCARE STAFF: - Please ensure there is not already an duplicate clearance open for this procedure. - Under Visit Info/Reason for Call, type in Other and utilize the format Clearance MM/DD/YY or Clearance TBD. Do not use dashes or single digits. - If request is for dental extraction, please clarify the # of teeth to be extracted.  Request for surgical clearance:  1. What type of surgery is being performed? 2 LEVEL CERVICAL FUSION   2. When is this surgery scheduled? 01/18/20   3. What type of clearance is required (medical clearance vs. Pharmacy clearance to hold med vs. Both)? MEDICAL  4. Are there any medications that need to be held prior to surgery and how long? NONE LISTED   5. Practice name and name of physician performing surgery? [facility]; DR. Monroe   6. What is the office phone number? [phone]   7.   What is the office fax number? [place].   Anesthesia type (None, local, MAC, general) ? GENERAL   Paul Morales 01/05/2020, 5:07 PM  _________________________________________________________________   (provider comments below)

## 2020-01-06 NOTE — Telephone Encounter (Signed)
   Primary Cardiologist: Jenkins Rouge, MD  Chart reviewed as part of pre-operative protocol coverage. Given past medical history and time since last visit, based on ACC/AHA guidelines, Paul Morales would be at acceptable risk for the planned procedure without further cardiovascular testing.   I will route this recommendation to the requesting party via Epic fax function and remove from pre-op pool.  Please call with questions.  [name] [name] NP-C    01/06/2020, 7:32 AM [place] [address] Office [phone] Fax [phone]

## 2020-01-10 NOTE — Progress Notes (Signed)
[facility] [address] [name] [address] Phone: [phone] Fax: [place], [name] [address] Phone: [phone] Fax: [phone]      Your procedure is scheduled on Wednesday, 01/18/2020.  Report to [facility] Main Entrance "A" at 10:30 A.M., and check in at the Admitting office.  Call this number if you have problems the morning of surgery:  [phone]  Call [phone] if you have any questions prior to your surgery date Monday-Friday 8am-4pm    Remember:  Do not eat after midnight the night before your surgery  You may drink clear liquids until 09:30am the morning of your surgery.   Clear liquids allowed are: Water, Non-Citrus Juices (without pulp), Carbonated Beverages, Clear Tea, Black Coffee Only, and Gatorade    The morning of surgery, DO NOT take any medications.  As of today, STOP taking any Aspirin (unless otherwise instructed by your surgeon) Aleve, Naproxen, Ibuprofen, Motrin, Advil, Goody's, BC's, all herbal medications, fish oil, and all vitamins.                      Do not wear jewelry, make up, or nail polish            Do not wear lotions, powders, colognes, or deodorant.            Men may shave face and neck.            Do not bring valuables to the hospital.            [facility] is not responsible for any belongings or valuables.  Do NOT Smoke (Tobacco/Vaping) or drink Alcohol 24 hours prior to your procedure  If you use a CPAP at night, you may bring all equipment for your overnight stay.   Contacts, glasses, dentures or bridgework may not be worn into surgery.      For patients admitted to the hospital, discharge time will be determined by your treatment team.   Patients discharged the day of surgery will not be allowed to drive home, and someone needs to stay with them for 24 hours.    Special instructions:   [facility]- Preparing For Surgery  Before surgery, you can play an important role. Because skin is not sterile, your skin needs to be as free of germs as possible. You can reduce the number of germs on your skin by washing with CHG (chlorahexidine gluconate) Soap before surgery.  CHG is an antiseptic cleaner which kills germs and bonds with the skin to continue killing germs even after washing.    Oral Hygiene is also important to reduce your risk of infection.  Remember - BRUSH YOUR TEETH THE MORNING OF SURGERY WITH YOUR REGULAR TOOTHPASTE  Please do not use if you have an allergy to CHG or antibacterial soaps. If your skin becomes reddened/irritated stop using the CHG.  Do not shave (including legs and underarms) for at least 48 hours prior to first CHG shower. It is OK to shave your face.  Please follow these instructions carefully.   1. Shower the NIGHT BEFORE SURGERY and the MORNING OF SURGERY with CHG Soap.   2. If you chose to wash your hair, wash your hair first as usual with your normal shampoo.  3. After you shampoo, rinse your hair and body thoroughly to remove the shampoo.  4.  Use CHG as you would any other liquid soap. You can apply CHG directly to the skin and wash gently with a scrungie or a clean washcloth.   5. Apply the CHG Soap to your body ONLY FROM THE NECK DOWN.  Do not use on open wounds or open sores. Avoid contact with your eyes, ears, mouth and genitals (private parts). Wash Face and genitals (private parts)  with your normal soap.   6. Wash thoroughly, paying special attention to the area where your surgery will be performed.  7. Thoroughly rinse your body with warm water from the neck down.  8. DO NOT shower/wash with your normal soap after using and rinsing off the CHG Soap.  9. Pat yourself dry with a CLEAN TOWEL.  10. Wear CLEAN PAJAMAS to bed the night before surgery  11. Place CLEAN SHEETS on your bed the night of your first shower and DO NOT SLEEP WITH  PETS.   Day of Surgery: Wear Clean/Comfortable clothing the morning of surgery Do not apply any deodorants/lotions.   Remember to brush your teeth WITH YOUR REGULAR TOOTHPASTE.   Please read over the following fact sheets that you were given.

## 2020-01-10 NOTE — Progress Notes (Signed)
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  States that symptoms unchanged from previous visit.  He is want to proceed with C3-4 and C4-5 ACDF is scheduled.  History and physical performed.  Surgical procedure discussed.  All questions answered.

## 2020-01-11 ENCOUNTER — Encounter (HOSPITAL_COMMUNITY): Payer: Self-pay

## 2020-01-11 ENCOUNTER — Encounter: Payer: Self-pay | Admitting: Surgery

## 2020-01-11 ENCOUNTER — Other Ambulatory Visit: Payer: Self-pay

## 2020-01-11 ENCOUNTER — Ambulatory Visit: Payer: PRIVATE HEALTH INSURANCE | Admitting: Surgery

## 2020-01-11 ENCOUNTER — Ambulatory Visit (INDEPENDENT_AMBULATORY_CARE_PROVIDER_SITE_OTHER): Payer: PRIVATE HEALTH INSURANCE | Admitting: Surgery

## 2020-01-11 ENCOUNTER — Ambulatory Visit (HOSPITAL_COMMUNITY)
Admission: RE | Admit: 2020-01-11 | Discharge: 2020-01-11 | Disposition: A | Discharge status: Home / Self Care | Payer: PRIVATE HEALTH INSURANCE | Source: Ambulatory Visit | Attending: Surgery | Admitting: Surgery

## 2020-01-11 ENCOUNTER — Encounter (HOSPITAL_COMMUNITY)
Admission: RE | Admit: 2020-01-11 | Discharge: 2020-01-11 | Disposition: A | Discharge status: Home / Self Care | Payer: PRIVATE HEALTH INSURANCE | Source: Ambulatory Visit | Attending: Orthopaedic Surgery | Admitting: Orthopaedic Surgery

## 2020-01-11 VITALS — BP 116/87 | HR 97 | Temp 99.2°F

## 2020-01-11 DIAGNOSIS — M4802 Spinal stenosis, cervical region: Secondary | ICD-10-CM

## 2020-01-11 DIAGNOSIS — Z01818 Encounter for other preprocedural examination: Secondary | ICD-10-CM | POA: Diagnosis present

## 2020-01-11 HISTORY — DX: Herpesviral infection, unspecified: B00.9

## 2020-01-11 HISTORY — DX: Cardiac murmur, unspecified: R01.1

## 2020-01-11 HISTORY — DX: Anemia, unspecified: D64.9

## 2020-01-11 HISTORY — DX: Personal history of urinary calculi: Z87.442

## 2020-01-11 LAB — RAPID URINE DRUG SCREEN, HOSP PERFORMED
Amphetamines: NOT DETECTED
Barbiturates: NOT DETECTED
Benzodiazepines: NOT DETECTED
Cocaine: NOT DETECTED
Opiates: NOT DETECTED
Tetrahydrocannabinol: POSITIVE — AB

## 2020-01-11 LAB — CBC
HCT: 39.7 % (ref 39.0–52.0)
Hemoglobin: 12.5 g/dL — ABNORMAL LOW (ref 13.0–17.0)
MCH: 28.5 pg (ref 26.0–34.0)
MCHC: 31.5 g/dL (ref 30.0–36.0)
MCV: 90.6 fL (ref 80.0–100.0)
Platelets: 485 10*3/uL — ABNORMAL HIGH (ref 150–400)
RBC: 4.38 MIL/uL (ref 4.22–5.81)
RDW: 15 % (ref 11.5–15.5)
WBC: 7.5 10*3/uL (ref 4.0–10.5)
nRBC: 0 % (ref 0.0–0.2)

## 2020-01-11 LAB — COMPREHENSIVE METABOLIC PANEL
ALT: 11 U/L (ref 0–44)
AST: 14 U/L — ABNORMAL LOW (ref 15–41)
Albumin: 2.8 g/dL — ABNORMAL LOW (ref 3.5–5.0)
Alkaline Phosphatase: 77 U/L (ref 38–126)
Anion gap: 8 (ref 5–15)
BUN: 8 mg/dL (ref 6–20)
CO2: 27 mmol/L (ref 22–32)
Calcium: 8.7 mg/dL — ABNORMAL LOW (ref 8.9–10.3)
Chloride: 106 mmol/L (ref 98–111)
Creatinine, Ser: 0.78 mg/dL (ref 0.61–1.24)
GFR calc Af Amer: >60 mL/min (ref >60)
GFR calc non Af Amer: >60 mL/min (ref >60)
Glucose, Bld: 66 mg/dL — ABNORMAL LOW (ref 70–99)
Potassium: 3.1 mmol/L — ABNORMAL LOW (ref 3.5–5.1)
Sodium: 141 mmol/L (ref 135–145)
Total Bilirubin: 0.4 mg/dL (ref 0.3–1.2)
Total Protein: 6.5 g/dL (ref 6.5–8.1)

## 2020-01-11 LAB — URINALYSIS, ROUTINE W REFLEX MICROSCOPIC
Bilirubin Urine: NEGATIVE
Glucose, UA: NEGATIVE mg/dL
Hgb urine dipstick: NEGATIVE
Ketones, ur: NEGATIVE mg/dL
Leukocytes,Ua: NEGATIVE
Nitrite: NEGATIVE
Protein, ur: NEGATIVE mg/dL
Specific Gravity, Urine: 1.027 (ref 1.005–1.030)
pH: 5 (ref 5.0–8.0)

## 2020-01-11 LAB — TYPE AND SCREEN
ABO/RH(D): A POS
Antibody Screen: NEGATIVE

## 2020-01-11 LAB — SURGICAL PCR SCREEN
MRSA, PCR: NEGATIVE
Staphylococcus aureus: POSITIVE — AB

## 2020-01-11 NOTE — Progress Notes (Signed)
  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  Continues have ongoing symptoms and wants to proceed with C3-4 and C4-5 ACDF.  We received preop cardiac clearance.  Today history and physical performed.  Temp 99.2.  Patient denies known fever, chills, cardiac pulmonary GI GU issues.  Patient as previously admitted marijuana use to our surgery scheduler and the cardiologist.  He denies any other illicit drug use.  Patient is going for preop labs today.  I did add a urine rapid drug screen to those labs.  Patient made aware of this and voices understanding.

## 2020-01-11 NOTE — Progress Notes (Signed)
Patient denies shortness of breath, fever, cough or chest pain.  PCP -  Dr Clemmie Krill Cardiologist - n/a [facility] - Dr Eduard Roux  Chest x-ray -  01/11/20 EKG - 01/11/20 Stress Test - n/a ECHO - 01/03/20 Cardiac Cath - n/a  ERAS:  Clears til 9:30 am DOS, Ensure drink given at PAT appt.  Anesthesia review: Yes  Coronavirus Screening Covid test is scheduled on 01/14/20. Do you have any of the following symptoms:  Cough yes/no: No Fever (>100.21F)  yes/no: No Runny nose yes/no: No Sore throat yes/no: No Difficulty breathing/shortness of breath  yes/no: No  Have you traveled in the last 14 days and where? yes/no: No  Patient verbalized understanding of instructions that were given to them at the PAT appointment.   IB message sent to MD for abnormal labs.

## 2020-01-11 NOTE — Progress Notes (Signed)
[facility] [address] [name] [address] Phone: [phone] Fax: [place], [name] [address] Phone: [phone] Fax: [phone]      Your procedure is scheduled on Wednesday, 01/18/2020.  Report to [facility] Main Entrance "A" at 10:30 A.M., and check in at the Admitting office.  Call this number if you have problems the morning of surgery:  [phone]  Call [phone] if you have any questions prior to your surgery date Monday-Friday 8am-4pm    Remember:  Do not eat after midnight the night before your surgery  You may drink clear liquids until 09:30am the morning of your surgery.   Clear liquids allowed are: Water, Non-Citrus Juices (without pulp), Carbonated Beverages, Clear Tea, Black Coffee Only, and Gatorade   Please complete your PRE-SURGERY ENSURE that was provided to you by 9:30 am the morning of surgery.  Please, if able, drink it in one setting. DO NOT SIP.   The morning of surgery, DO NOT take any medications.  As of today, STOP taking any Aspirin (unless otherwise instructed by your surgeon) Aleve, Naproxen, Ibuprofen, Motrin, Advil, Goody's, BC's, all herbal medications, fish oil, and all vitamins.                      Do not wear jewelry.            Do not wear lotions, powders, colognes, or deodorant.            Men may shave face and neck.            Do not bring valuables to the hospital.            [facility] is not responsible for any belongings or valuables.  Do NOT Smoke (Tobacco/Vaping) or drink Alcohol 24 hours prior to your procedure  If you use a CPAP at night, you may bring all equipment for your overnight stay.   Contacts, glasses, dentures or bridgework may not be worn into surgery.      For patients admitted to the hospital, discharge time will be determined by your treatment team.   Patients discharged the day of  surgery will not be allowed to drive home, and someone needs to stay with them for 24 hours.    Special instructions:   [facility]- Preparing For Surgery  Before surgery, you can play an important role. Because skin is not sterile, your skin needs to be as free of germs as possible. You can reduce the number of germs on your skin by washing with CHG (chlorahexidine gluconate) Soap before surgery.  CHG is an antiseptic cleaner which kills germs and bonds with the skin to continue killing germs even after washing.    Oral Hygiene is also important to reduce your risk of infection.  Remember - BRUSH YOUR TEETH THE MORNING OF SURGERY WITH YOUR REGULAR TOOTHPASTE  Please do not use if you have an allergy to CHG or antibacterial soaps. If your skin becomes reddened/irritated stop using the CHG.  Do not shave (including legs and underarms) for at least 48 hours prior to first CHG shower. It is OK to shave your face.  Please follow these instructions carefully.   1. Shower the NIGHT BEFORE SURGERY Tues and the MORNING OF SURGERY Wed with CHG Soap.   2. If you chose to wash your hair,  wash your hair first as usual with your normal shampoo.  3. After you shampoo, rinse your hair and body thoroughly to remove the shampoo.  4. Use CHG as you would any other liquid soap. You can apply CHG directly to the skin and wash gently with a scrungie or a clean washcloth.   5. Apply the CHG Soap to your body ONLY FROM THE NECK DOWN.  Do not use on open wounds or open sores. Avoid contact with your eyes, ears, mouth and genitals (private parts). Wash Face and genitals (private parts)  with your normal soap.   6. Wash thoroughly, paying special attention to the area where your surgery will be performed.  7. Thoroughly rinse your body with warm water from the neck down.  8. DO NOT shower/wash with your normal soap after using and rinsing off the CHG Soap.  9. Pat yourself dry with a CLEAN TOWEL.  10. Wear  CLEAN PAJAMAS to bed the night before surgery  11. Place CLEAN SHEETS on your bed the night of your first shower and DO NOT SLEEP WITH PETS.   Day of Surgery: Wear Clean/Comfortable clothing the morning of surgery Do not apply any deodorants/lotions.   Remember to brush your teeth WITH YOUR REGULAR TOOTHPASTE.   Please read over the following fact sheets that you were given.

## 2020-01-12 NOTE — Anesthesia Preprocedure Evaluation (Addendum)
Anesthesia Evaluation  Patient identified by MRN, date of birth, ID band Patient awake    Reviewed: Allergy & Precautions, NPO status , Patient's Chart, lab work & pertinent test results  History of Anesthesia Complications Negative for: history of anesthetic complications  Airway Mallampati: II  TM Distance: >3 FB Neck ROM: Limited    Dental  (+) Dental Advisory Given, Teeth Intact   Pulmonary Current Smoker and Patient abstained from smoking.,    Pulmonary exam normal        Cardiovascular negative cardio ROS Normal cardiovascular exam     Neuro/Psych negative psych ROS   GI/Hepatic negative GI ROS, (+)     substance abuse  marijuana use,  Crohn's disease   Endo/Other   Hypokalemia   Renal/GU negative Renal ROS     Musculoskeletal negative musculoskeletal ROS (+)   Abdominal   Peds  Hematology negative hematology ROS (+)   Anesthesia Other Findings Covid test negative HSV See PAT note  Reproductive/Obstetrics                           Anesthesia Physical Anesthesia Plan  ASA: II  Anesthesia Plan: General   Post-op Pain Management:    Induction: Intravenous  PONV Risk Score and Plan: 3 and Treatment may vary due to age or medical condition, Ondansetron, Midazolam and Dexamethasone  Airway Management Planned: Oral ETT and Video Laryngoscope Planned  Additional Equipment: None  Intra-op Plan:   Post-operative Plan: Extubation in OR  Informed Consent: I have reviewed the patients History and Physical, chart, labs and discussed the procedure including the risks, benefits and alternatives for the proposed anesthesia with the patient or authorized representative who has indicated his/her understanding and acceptance.     Dental advisory given  Plan Discussed with: CRNA and Anesthesiologist  Anesthesia Plan Comments:       Anesthesia Quick Evaluation

## 2020-01-12 NOTE — Progress Notes (Addendum)
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 Surgery was initially scheduled for 12/21/19, but preoperative cardiology evaluation recommended after he called his gastroenterologist with symptoms of chest pain. Since then he had cardiology evaluation by Dr. Johnsie Cancel on 12/27/19. Chest symptoms were atypical and not felt cardiac related. He did have a benign sounding SEM, so echo ordered which showed normal LVEF without significant valvular abnormality. Lungs sounds were clear, but given smoking and marijuana use, Dr. Johnsie Cancel recommended a preoperative CXR.   Other history includes smoking (about 5 cigars/day), IBS, ileocolonic Crohn's disease (diagnosed 12/2016), murmur (mild TR 12/2019 echo), anemia.   Formal preoperative cardiology input outlined on 01/06/20 by Sheela Stack, NP, "Given past medical history and time since last visit, based on ACC/AHA guidelines, Gautham Hewins would be at acceptable risk for the planned procedure without further cardiovascular testing."  He is on Humira Q 14 days and sulfasalazine for Crohn's disease. Per previous discussion with Dr. Lorin Mercy, hold Humira prior to surgery.   01/12/20 preoperative testing results included a CXR showing left lung base linear denisty that could represent atelectasis versus infiltrate and a UDS + for THC. Message regarding CXR, UDS sent to Dr. Lorin Mercy and Benjiman Core, PA-C. Jeneen Rinks evaluated Mr. Carrick on the morning of the CXR, and by notes, patient without sick symptoms. As long as patient remains asymptomatic, without concern for active/recent PNA, then would defer any additional preoperative recommendation to his surgical  team. (UPDATE 01/12/20 5:08 PM: Dr. Lorin Mercy would like patient to have either primary care or pulmonology evaluation prior to surgery given CXR findings.)  Presurgical COVID-19 test scheduled for 01/14/20.   ADDENDUM 01/17/20 1:53 PM: Per follow-up from Myer Haff at Dr. Duaine Dredge office, Mr. Isenberg was re-evaluated by Clemmie Krill, PA-C at [facility] on 01/14/20. Debbie received a call from [place] at [facility] indicating that [employer] had cleared patient for surgery given he was symptomatic (no concern for active pneumonia), but will have additional chest follow-up after surgery (partial copy of 01/14/20 office note indicates post-operative chest follow-up will include a chest CT). 01/14/20 COVID-19 test was negative. Anesthesia team to evaluate on the day of surgery.    VS: BP (!) 111/92   Pulse 100   Temp 37.2 C (Oral)   Resp 18   Ht 5' 5"  (1.651 m)   Wt 53.6 kg   SpO2 99%   BMI 19.65 kg/m    PROVIDERS: - Burdine, Virgina Evener, MD is PCP. Sees Clemmie Krill, PA-C ([facility] in [place]).  Eduard Roux, MD is GI ([facility]). Last evaluation 12/15/19. /21.  Jenkins Rouge, MD is cardiologist. Seen for preoperative evaluation on 12/27/19. As needed cardiology follow-up recommended.   LABS: Labs reviewed: Acceptable for surgery. (all labs ordered are listed, but only abnormal results are displayed)  Labs Reviewed  SURGICAL PCR SCREEN - Abnormal; Notable for the following components:      Result Value   Staphylococcus aureus POSITIVE (*)    All other components within normal limits  RAPID URINE DRUG SCREEN, HOSP PERFORMED - Abnormal; Notable for the following components:   Tetrahydrocannabinol POSITIVE (*)  All other components within normal limits  CBC - Abnormal; Notable for the following components:   Hemoglobin 12.5 (*)    Platelets 485 (*)    All other components within normal limits  COMPREHENSIVE METABOLIC PANEL -  Abnormal; Notable for the following components:   Potassium 3.1 (*)    Glucose, Bld 66 (*)    Calcium 8.7 (*)    Albumin 2.8 (*)    AST 14 (*)    All other components within normal limits  URINALYSIS, ROUTINE W REFLEX MICROSCOPIC  TYPE AND SCREEN     IMAGES: CXR 01/11/20: FINDINGS: Left lung base linear density, likely atelectasis/scarring. There is background of chronic interstitial coarsening and probable mild emphysema. Increased density at the left lung base, seen on the lateral view with silhouetting of the posterior left hemidiaphragm may represent atelectasis or infiltrate. No large pleural effusion or pneumothorax. The cardiac silhouette is within limits. No acute osseous pathology. IMPRESSION: Left lung base atelectasis versus infiltrate.   EKG: 01/11/20:  Normal sinus rhythm Possible Left atrial enlargement Minimal voltage criteria for LVH, may be normal variant ( Sokolow-Lyon ) Borderline ECG No old tracing to compare Confirmed by Daneen Schick [phone]) on 01/11/2020 11:36:31 AM   CV: Echo 01/03/20: IMPRESSIONS  1. Left ventricular ejection fraction, by estimation, is 60 to 65%. The  left ventricle has normal function. The left ventricle has no regional  wall motion abnormalities. There is mild left ventricular hypertrophy.  Left ventricular diastolic parameters  were normal.  2. Right ventricular systolic function is normal. The right ventricular  size is normal. There is normal pulmonary artery systolic pressure.  3. Left atrial size was moderately dilated.  4. The mitral valve is normal in structure. No evidence of mitral valve  regurgitation. No evidence of mitral stenosis.   5. The tricuspid valve is normal in structure. Mild tricuspid regurgitation. 6. The aortic valve is tricuspid. Mild aortic valve annular  calcification. There is mild thickening of the aortic valve. Aortic valve  regurgitation is not visualized. No aortic stenosis is present.  7.  The inferior vena cava is normal in size with greater than 50%  respiratory variability, suggesting right atrial pressure of 3 mmHg.    Past Medical History:  Diagnosis Date  . Anemia   . Crohn's disease ([place])   . Heart murmur    "T9" per patient  . History of kidney stones    surgery to remove  . HSV infection   . IBS (irritable bowel syndrome) 04/08/2016    Past Surgical History:  Procedure Laterality Date  . COLONOSCOPY N/A 12/22/2016   Procedure: COLONOSCOPY;  Surgeon: Rogene Houston, MD;  Location: [facility];  Service: Endoscopy;  Laterality: N/A;  12:00  . LITHOTRIPSY    . VASECTOMY    . WISDOM TOOTH EXTRACTION      MEDICATIONS: . Adalimumab (HUMIRA) 40 MG/0.4ML PSKT  . dicyclomine (BENTYL) 10 MG capsule  . ferrous sulfate 325 (65 FE) MG tablet  . sulfaSALAzine (AZULFIDINE) 500 MG tablet   No current facility-administered medications for this encounter.    Myra Gianotti, PA-C Surgical Short Stay/Anesthesiology [facility] Phone [phone] [facility] Phone [phone] 01/12/2020 3:00 PM

## 2020-01-13 ENCOUNTER — Telehealth: Payer: Self-pay | Admitting: Orthopaedic Surgery

## 2020-01-13 NOTE — Telephone Encounter (Signed)
Spoke with Primitivo Gauze at [facility] regarding patient's upcoming surgery, recent chest xrays results, and patient's temp (99.2) at the time of H&P with Benjiman Core, PA-C on 01-11-20. She will relay message to Tawni Carnes, PA-C and try to get the patient in tomorrow (Saturday Emergency Clinic Hours) or Monday July 12th.   Notes faxed to [phone] and emailed to the attention of [place], [place].

## 2020-01-14 ENCOUNTER — Other Ambulatory Visit (HOSPITAL_COMMUNITY)
Admission: RE | Admit: 2020-01-14 | Discharge: 2020-01-14 | Disposition: A | Discharge status: Home / Self Care | Payer: PRIVATE HEALTH INSURANCE | Source: Ambulatory Visit | Attending: Orthopaedic Surgery | Admitting: Orthopaedic Surgery

## 2020-01-14 DIAGNOSIS — Z01812 Encounter for preprocedural laboratory examination: Secondary | ICD-10-CM | POA: Diagnosis present

## 2020-01-14 DIAGNOSIS — Z20822 Contact with and (suspected) exposure to covid-19: Secondary | ICD-10-CM | POA: Insufficient documentation

## 2020-01-14 LAB — SARS CORONAVIRUS 2 (TAT 6-24 HRS): SARS Coronavirus 2: NEGATIVE

## 2020-01-16 ENCOUNTER — Telehealth: Payer: Self-pay | Admitting: Orthopaedic Surgery

## 2020-01-16 NOTE — Telephone Encounter (Signed)
Called Tawni Carnes PA-C at [facility] [phone]) to get update of patient's clearnce for surgery on 01-18-20. Unable to speak with PA. Spoke with Caryl Pina and she said she would return the call.  About 20 minutes later, Caryl Pina called back and stated patient was cleared. She stated patient was asymptomatic on Saturday. Caryl Pina also said Joellen Jersey would have him seen for his chest AFTER the surgery.

## 2020-01-18 ENCOUNTER — Ambulatory Visit (HOSPITAL_COMMUNITY): Payer: PRIVATE HEALTH INSURANCE | Admitting: Anesthesiology

## 2020-01-18 ENCOUNTER — Observation Stay (HOSPITAL_COMMUNITY)
Admission: RE | Admit: 2020-01-18 | Discharge: 2020-01-19 | Disposition: A | Discharge status: Home / Self Care | Payer: PRIVATE HEALTH INSURANCE | Attending: Orthopaedic Surgery | Admitting: Orthopaedic Surgery

## 2020-01-18 ENCOUNTER — Encounter (HOSPITAL_COMMUNITY)
Admission: RE | Disposition: A | Discharge status: Home / Self Care | Payer: Self-pay | Source: Home / Self Care | Attending: Orthopaedic Surgery

## 2020-01-18 ENCOUNTER — Other Ambulatory Visit: Payer: Self-pay

## 2020-01-18 ENCOUNTER — Ambulatory Visit (HOSPITAL_COMMUNITY): Payer: PRIVATE HEALTH INSURANCE

## 2020-01-18 ENCOUNTER — Ambulatory Visit (HOSPITAL_COMMUNITY): Payer: PRIVATE HEALTH INSURANCE | Admitting: Vascular Surgery

## 2020-01-18 ENCOUNTER — Encounter (HOSPITAL_COMMUNITY): Payer: Self-pay | Admitting: Orthopaedic Surgery

## 2020-01-18 DIAGNOSIS — M5010 Cervical disc disorder with radiculopathy, unspecified cervical region: Secondary | ICD-10-CM | POA: Insufficient documentation

## 2020-01-18 DIAGNOSIS — F17290 Nicotine dependence, other tobacco product, uncomplicated: Secondary | ICD-10-CM | POA: Insufficient documentation

## 2020-01-18 DIAGNOSIS — R942 Abnormal results of pulmonary function studies: Secondary | ICD-10-CM | POA: Insufficient documentation

## 2020-01-18 DIAGNOSIS — Z79899 Other long term (current) drug therapy: Secondary | ICD-10-CM | POA: Insufficient documentation

## 2020-01-18 DIAGNOSIS — M4802 Spinal stenosis, cervical region: Principal | ICD-10-CM

## 2020-01-18 DIAGNOSIS — M5020 Other cervical disc displacement, unspecified cervical region: Secondary | ICD-10-CM | POA: Diagnosis present

## 2020-01-18 DIAGNOSIS — Z419 Encounter for procedure for purposes other than remedying health state, unspecified: Secondary | ICD-10-CM

## 2020-01-18 HISTORY — PX: ANTERIOR CERVICAL DECOMP/DISCECTOMY FUSION: SHX1161

## 2020-01-18 LAB — ABO/RH: ABO/RH(D): A POS

## 2020-01-18 LAB — RAPID URINE DRUG SCREEN, HOSP PERFORMED
Amphetamines: NOT DETECTED
Barbiturates: NOT DETECTED
Benzodiazepines: NOT DETECTED
Cocaine: NOT DETECTED
Opiates: NOT DETECTED
Tetrahydrocannabinol: POSITIVE — AB

## 2020-01-18 SURGERY — ANTERIOR CERVICAL DECOMPRESSION/DISCECTOMY FUSION 2 LEVELS
Anesthesia: General

## 2020-01-18 MED ORDER — METHOCARBAMOL 500 MG PO TABS
500.0000 mg | ORAL_TABLET | Freq: Four times a day (QID) | ORAL | Status: DC | PRN
  Administered 2020-01-18 – 2020-01-19 (×2): 500 mg via ORAL
  Filled 2020-01-18 (×2): qty 1

## 2020-01-18 MED ORDER — METHOCARBAMOL 1000 MG/10ML IJ SOLN
500.0000 mg | Freq: Four times a day (QID) | INTRAVENOUS | Status: DC | PRN
  Filled 2020-01-18: qty 5

## 2020-01-18 MED ORDER — SUGAMMADEX SODIUM 200 MG/2ML IV SOLN
INTRAVENOUS | Status: DC | PRN
  Administered 2020-01-18: 200 mg via INTRAVENOUS

## 2020-01-18 MED ORDER — 0.9 % SODIUM CHLORIDE (POUR BTL) OPTIME
TOPICAL | Status: DC | PRN
  Administered 2020-01-18: 1000 mL

## 2020-01-18 MED ORDER — ORAL CARE MOUTH RINSE: 15.0000 mL | Freq: Once | OROMUCOSAL | Status: AC

## 2020-01-18 MED ORDER — ACETAMINOPHEN 650 MG RE SUPP: 650.0000 mg | RECTAL | Status: DC | PRN

## 2020-01-18 MED ORDER — CHLORHEXIDINE GLUCONATE 0.12 % MT SOLN
15.0000 mL | Freq: Once | OROMUCOSAL | Status: AC
  Administered 2020-01-18: 15 mL via OROMUCOSAL
  Filled 2020-01-18: qty 15

## 2020-01-18 MED ORDER — PHENYLEPHRINE 40 MCG/ML (10ML) SYRINGE FOR IV PUSH (FOR BLOOD PRESSURE SUPPORT)
PREFILLED_SYRINGE | INTRAVENOUS | Status: AC
  Filled 2020-01-18: qty 10

## 2020-01-18 MED ORDER — DEXMEDETOMIDINE (PRECEDEX) IN NS 20 MCG/5ML (4 MCG/ML) IV SYRINGE
PREFILLED_SYRINGE | INTRAVENOUS | Status: AC
  Filled 2020-01-18: qty 5

## 2020-01-18 MED ORDER — SODIUM CHLORIDE 0.9 % IV SOLN: INTRAVENOUS | Status: DC

## 2020-01-18 MED ORDER — FENTANYL CITRATE (PF) 100 MCG/2ML IJ SOLN
INTRAMUSCULAR | Status: AC
  Filled 2020-01-18: qty 2

## 2020-01-18 MED ORDER — ROCURONIUM BROMIDE 10 MG/ML (PF) SYRINGE
PREFILLED_SYRINGE | INTRAVENOUS | Status: AC
  Filled 2020-01-18: qty 20

## 2020-01-18 MED ORDER — OXYCODONE-ACETAMINOPHEN 5-325 MG PO TABS: 1.0000 | ORAL_TABLET | ORAL | 0 refills | Status: DC | PRN

## 2020-01-18 MED ORDER — DEXMEDETOMIDINE (PRECEDEX) IN NS 20 MCG/5ML (4 MCG/ML) IV SYRINGE
PREFILLED_SYRINGE | INTRAVENOUS | Status: DC | PRN
  Administered 2020-01-18: 4 ug via INTRAVENOUS
  Administered 2020-01-18 (×2): 8 ug via INTRAVENOUS

## 2020-01-18 MED ORDER — ONDANSETRON HCL 4 MG PO TABS
4.0000 mg | ORAL_TABLET | Freq: Four times a day (QID) | ORAL | Status: DC | PRN
  Administered 2020-01-19: 4 mg via ORAL
  Filled 2020-01-18: qty 1

## 2020-01-18 MED ORDER — GLYCOPYRROLATE PF 0.2 MG/ML IJ SOSY
PREFILLED_SYRINGE | INTRAMUSCULAR | Status: AC
  Filled 2020-01-18: qty 1

## 2020-01-18 MED ORDER — DOCUSATE SODIUM 100 MG PO CAPS
100.0000 mg | ORAL_CAPSULE | Freq: Two times a day (BID) | ORAL | Status: DC
  Administered 2020-01-18: 100 mg via ORAL
  Filled 2020-01-18: qty 1

## 2020-01-18 MED ORDER — EPHEDRINE 5 MG/ML INJ
INTRAVENOUS | Status: AC
  Filled 2020-01-18: qty 10

## 2020-01-18 MED ORDER — PHENYLEPHRINE HCL (PRESSORS) 10 MG/ML IV SOLN
INTRAVENOUS | Status: DC | PRN
  Administered 2020-01-18: 80 ug via INTRAVENOUS

## 2020-01-18 MED ORDER — FERROUS SULFATE 325 (65 FE) MG PO TABS
325.0000 mg | ORAL_TABLET | Freq: Three times a day (TID) | ORAL | Status: DC
  Administered 2020-01-18: 325 mg via ORAL
  Filled 2020-01-18: qty 1

## 2020-01-18 MED ORDER — BUPIVACAINE HCL (PF) 0.25 % IJ SOLN
INTRAMUSCULAR | Status: AC
  Filled 2020-01-18: qty 30

## 2020-01-18 MED ORDER — SODIUM CHLORIDE 0.9% FLUSH: 3.0000 mL | INTRAVENOUS | Status: DC | PRN

## 2020-01-18 MED ORDER — ACETAMINOPHEN 10 MG/ML IV SOLN
INTRAVENOUS | Status: AC
  Filled 2020-01-18: qty 100

## 2020-01-18 MED ORDER — ACETAMINOPHEN 325 MG PO TABS
650.0000 mg | ORAL_TABLET | ORAL | Status: DC | PRN
  Administered 2020-01-18 – 2020-01-19 (×3): 650 mg via ORAL
  Filled 2020-01-18 (×3): qty 2

## 2020-01-18 MED ORDER — METHOCARBAMOL 500 MG PO TABS: 500.0000 mg | ORAL_TABLET | Freq: Four times a day (QID) | ORAL | 0 refills | Status: AC | PRN

## 2020-01-18 MED ORDER — HEMOSTATIC AGENTS (NO CHARGE) OPTIME
TOPICAL | Status: DC | PRN
  Administered 2020-01-18: 1 via TOPICAL

## 2020-01-18 MED ORDER — FENTANYL CITRATE (PF) 100 MCG/2ML IJ SOLN
INTRAMUSCULAR | Status: DC | PRN
  Administered 2020-01-18: 50 ug via INTRAVENOUS
  Administered 2020-01-18 (×2): 100 ug via INTRAVENOUS

## 2020-01-18 MED ORDER — DICYCLOMINE HCL 10 MG PO CAPS
10.0000 mg | ORAL_CAPSULE | Freq: Three times a day (TID) | ORAL | Status: DC
  Administered 2020-01-18 – 2020-01-19 (×2): 10 mg via ORAL
  Filled 2020-01-18 (×2): qty 1

## 2020-01-18 MED ORDER — PROPOFOL 10 MG/ML IV BOLUS
INTRAVENOUS | Status: DC | PRN
  Administered 2020-01-18: 200 mg via INTRAVENOUS

## 2020-01-18 MED ORDER — PHENOL 1.4 % MT LIQD
1.0000 | OROMUCOSAL | Status: DC | PRN
  Filled 2020-01-18: qty 177

## 2020-01-18 MED ORDER — ROCURONIUM BROMIDE 10 MG/ML (PF) SYRINGE
PREFILLED_SYRINGE | INTRAVENOUS | Status: AC
  Filled 2020-01-18: qty 10

## 2020-01-18 MED ORDER — SODIUM CHLORIDE 0.9% FLUSH: 3.0000 mL | Freq: Two times a day (BID) | INTRAVENOUS | Status: DC

## 2020-01-18 MED ORDER — PROMETHAZINE HCL 25 MG/ML IJ SOLN: 6.2500 mg | INTRAMUSCULAR | Status: DC | PRN

## 2020-01-18 MED ORDER — BUPIVACAINE HCL (PF) 0.25 % IJ SOLN
INTRAMUSCULAR | Status: DC | PRN
  Administered 2020-01-18: 30 mL

## 2020-01-18 MED ORDER — CEFAZOLIN SODIUM-DEXTROSE 1-4 GM/50ML-% IV SOLN
1.0000 g | Freq: Three times a day (TID) | INTRAVENOUS | Status: AC
  Administered 2020-01-18 – 2020-01-19 (×2): 1 g via INTRAVENOUS
  Filled 2020-01-18 (×2): qty 50

## 2020-01-18 MED ORDER — HYDROMORPHONE HCL 1 MG/ML IJ SOLN: 0.5000 mg | INTRAMUSCULAR | Status: DC | PRN

## 2020-01-18 MED ORDER — ONDANSETRON HCL 4 MG/2ML IJ SOLN
4.0000 mg | Freq: Four times a day (QID) | INTRAMUSCULAR | Status: DC | PRN
  Administered 2020-01-18: 4 mg via INTRAVENOUS
  Filled 2020-01-18: qty 2

## 2020-01-18 MED ORDER — LIDOCAINE HCL (CARDIAC) PF 100 MG/5ML IV SOSY
PREFILLED_SYRINGE | INTRAVENOUS | Status: DC | PRN
  Administered 2020-01-18: 60 mg via INTRAVENOUS

## 2020-01-18 MED ORDER — FENTANYL CITRATE (PF) 100 MCG/2ML IJ SOLN
25.0000 ug | INTRAMUSCULAR | Status: DC | PRN
  Administered 2020-01-18 (×2): 25 ug via INTRAVENOUS
  Administered 2020-01-18: 50 ug via INTRAVENOUS

## 2020-01-18 MED ORDER — FENTANYL CITRATE (PF) 250 MCG/5ML IJ SOLN
INTRAMUSCULAR | Status: AC
  Filled 2020-01-18: qty 5

## 2020-01-18 MED ORDER — LACTATED RINGERS IV SOLN: INTRAVENOUS | Status: DC | PRN

## 2020-01-18 MED ORDER — ONDANSETRON HCL 4 MG/2ML IJ SOLN
INTRAMUSCULAR | Status: AC
  Filled 2020-01-18: qty 2

## 2020-01-18 MED ORDER — MENTHOL 3 MG MT LOZG: 1.0000 | LOZENGE | OROMUCOSAL | Status: DC | PRN

## 2020-01-18 MED ORDER — LIDOCAINE 2% (20 MG/ML) 5 ML SYRINGE
INTRAMUSCULAR | Status: AC
  Filled 2020-01-18: qty 5

## 2020-01-18 MED ORDER — ONDANSETRON HCL 4 MG/2ML IJ SOLN
INTRAMUSCULAR | Status: DC | PRN
  Administered 2020-01-18: 4 mg via INTRAVENOUS

## 2020-01-18 MED ORDER — SULFASALAZINE 500 MG PO TABS
500.0000 mg | ORAL_TABLET | Freq: Two times a day (BID) | ORAL | Status: DC
  Administered 2020-01-18: 500 mg via ORAL
  Filled 2020-01-18 (×2): qty 1

## 2020-01-18 MED ORDER — EPINEPHRINE PF 1 MG/ML IJ SOLN
INTRAMUSCULAR | Status: AC
  Filled 2020-01-18: qty 1

## 2020-01-18 MED ORDER — OXYCODONE HCL 5 MG/5ML PO SOLN: 5.0000 mg | Freq: Once | ORAL | Status: DC | PRN

## 2020-01-18 MED ORDER — PROPOFOL 10 MG/ML IV BOLUS
INTRAVENOUS | Status: AC
  Filled 2020-01-18: qty 20

## 2020-01-18 MED ORDER — CEFAZOLIN SODIUM-DEXTROSE 2-4 GM/100ML-% IV SOLN
2.0000 g | INTRAVENOUS | Status: AC
  Administered 2020-01-18: 2 g via INTRAVENOUS
  Filled 2020-01-18: qty 100

## 2020-01-18 MED ORDER — MIDAZOLAM HCL 5 MG/5ML IJ SOLN
INTRAMUSCULAR | Status: DC | PRN
  Administered 2020-01-18: 2 mg via INTRAVENOUS

## 2020-01-18 MED ORDER — MIDAZOLAM HCL 2 MG/2ML IJ SOLN
INTRAMUSCULAR | Status: AC
  Filled 2020-01-18: qty 2

## 2020-01-18 MED ORDER — POLYETHYLENE GLYCOL 3350 17 G PO PACK
17.0000 g | PACK | Freq: Every day | ORAL | Status: DC
  Administered 2020-01-18: 17 g via ORAL
  Filled 2020-01-18: qty 1

## 2020-01-18 MED ORDER — OXYCODONE HCL 5 MG PO TABS
5.0000 mg | ORAL_TABLET | ORAL | Status: DC | PRN
  Administered 2020-01-18 – 2020-01-19 (×3): 5 mg via ORAL
  Filled 2020-01-18 (×3): qty 1

## 2020-01-18 MED ORDER — OXYCODONE HCL 5 MG PO TABS: 5.0000 mg | ORAL_TABLET | Freq: Once | ORAL | Status: DC | PRN

## 2020-01-18 MED ORDER — EPINEPHRINE PF 1 MG/ML IJ SOLN
INTRAMUSCULAR | Status: DC | PRN
  Administered 2020-01-18: 1 mg

## 2020-01-18 MED ORDER — ROCURONIUM BROMIDE 100 MG/10ML IV SOLN
INTRAVENOUS | Status: DC | PRN
  Administered 2020-01-18: 30 mg via INTRAVENOUS
  Administered 2020-01-18: 70 mg via INTRAVENOUS

## 2020-01-18 SURGICAL SUPPLY — 55 items
BENZOIN TINCTURE PRP APPL 2/3 (GAUZE/BANDAGES/DRESSINGS) ×3 IMPLANT
BIT DRILL SPINE QC 14 (BIT) ×3 IMPLANT
BLADE CLIPPER SURG (BLADE) IMPLANT
BUR ROUND FLUTED 4 SOFT TCH (BURR) IMPLANT
BUR ROUND FLUTED 4MM SOFT TCH (BURR)
CLOSURE WOUND 1/2 X4 (GAUZE/BANDAGES/DRESSINGS) ×1
COLLAR CERV LO CONTOUR FIRM DE (SOFTGOODS) ×3 IMPLANT
CORD BIPOLAR FORCEPS 12FT (ELECTRODE) ×3 IMPLANT
COVER MAYO STAND STRL (DRAPES) ×3 IMPLANT
COVER SURGICAL LIGHT HANDLE (MISCELLANEOUS) ×3 IMPLANT
COVER WAND RF STERILE (DRAPES) IMPLANT
DRAPE C-ARM 42X72 X-RAY (DRAPES) ×3 IMPLANT
DRAPE HALF SHEET 40X57 (DRAPES) ×3 IMPLANT
DRAPE MICROSCOPE LEICA (MISCELLANEOUS) ×3 IMPLANT
DURAPREP 6ML APPLICATOR 50/CS (WOUND CARE) ×3 IMPLANT
ELECT COATED BLADE 2.86 ST (ELECTRODE) ×3 IMPLANT
ELECT REM PT RETURN 9FT ADLT (ELECTROSURGICAL) ×3
ELECTRODE REM PT RTRN 9FT ADLT (ELECTROSURGICAL) ×1 IMPLANT
EVACUATOR 1/8 PVC DRAIN (DRAIN) IMPLANT
GAUZE SPONGE 4X4 12PLY STRL (GAUZE/BANDAGES/DRESSINGS) ×3 IMPLANT
GLOVE BIOGEL PI IND STRL 8 (GLOVE) ×2 IMPLANT
GLOVE BIOGEL PI INDICATOR 8 (GLOVE) ×4
GLOVE ORTHO TXT STRL SZ7.5 (GLOVE) ×6 IMPLANT
GOWN STRL REUS W/ TWL LRG LVL3 (GOWN DISPOSABLE) ×1 IMPLANT
GOWN STRL REUS W/ TWL XL LVL3 (GOWN DISPOSABLE) ×1 IMPLANT
GOWN STRL REUS W/TWL 2XL LVL3 (GOWN DISPOSABLE) ×3 IMPLANT
GOWN STRL REUS W/TWL LRG LVL3 (GOWN DISPOSABLE) ×2
GOWN STRL REUS W/TWL XL LVL3 (GOWN DISPOSABLE) ×2
HALTER HD/CHIN CERV TRACTION D (MISCELLANEOUS) ×3 IMPLANT
HEMOSTAT SURGICEL 2X14 (HEMOSTASIS) IMPLANT
KIT BASIN OR (CUSTOM PROCEDURE TRAY) ×3 IMPLANT
KIT TURNOVER KIT B (KITS) ×3 IMPLANT
MANIFOLD NEPTUNE II (INSTRUMENTS) IMPLANT
NEEDLE 25GX 5/8IN NON SAFETY (NEEDLE) ×3 IMPLANT
NS IRRIG 1000ML POUR BTL (IV SOLUTION) ×3 IMPLANT
PACK ORTHO CERVICAL (CUSTOM PROCEDURE TRAY) ×3 IMPLANT
PAD ARMBOARD 7.5X6 YLW CONV (MISCELLANEOUS) ×6 IMPLANT
PATTIES SURGICAL .5 X.5 (GAUZE/BANDAGES/DRESSINGS) IMPLANT
PLATE ANT CERV XTEND 2 LV 32 (Plate) ×3 IMPLANT
POSITIONER HEAD DONUT 9IN (MISCELLANEOUS) ×3 IMPLANT
RESTRAINT LIMB HOLDER UNIV (RESTRAINTS) IMPLANT
SCREW XTD VAR 4.2 SELF TAP (Screw) ×18 IMPLANT
SPACER CERVICAL FRGE 12X14X8-7 (Spacer) ×6 IMPLANT
STRIP CLOSURE SKIN 1/2X4 (GAUZE/BANDAGES/DRESSINGS) ×2 IMPLANT
SURGIFLO W/THROMBIN 8M KIT (HEMOSTASIS) ×3 IMPLANT
SUT BONE WAX W31G (SUTURE) ×3 IMPLANT
SUT VIC AB 3-0 PS2 18 (SUTURE) ×3 IMPLANT
SUT VIC AB 3-0 X1 27 (SUTURE) ×3 IMPLANT
SUT VIC AB 4-0 PS2 18 (SUTURE) ×3 IMPLANT
SUT VICRYL 4-0 PS2 18IN ABS (SUTURE) ×6 IMPLANT
TAPE CLOTH SURG 4X10 WHT LF (GAUZE/BANDAGES/DRESSINGS) ×3 IMPLANT
TOWEL GREEN STERILE (TOWEL DISPOSABLE) ×3 IMPLANT
TOWEL GREEN STERILE FF (TOWEL DISPOSABLE) ×3 IMPLANT
TRAY FOLEY W/BAG SLVR 16FR (SET/KITS/TRAYS/PACK)
TRAY FOLEY W/BAG SLVR 16FR ST (SET/KITS/TRAYS/PACK) IMPLANT

## 2020-01-18 NOTE — Transfer of Care (Signed)
Immediate Anesthesia Transfer of Care Note  Patient: Paul Morales  Procedure(s) Performed: c3-4, c4-5 ANTERIOR CERVICAL DECOMPRESSION/DISCECTOMY FUSION, ALLOGRAFT, PLATE (N/A )  Patient Location: PACU  Anesthesia Type:General  Level of Consciousness: awake, alert  and oriented  Airway & Oxygen Therapy: Patient Spontanous Breathing and Patient connected to nasal cannula oxygen  Post-op Assessment: Report given to RN, Post -op Vital signs reviewed and stable and Patient moving all extremities  Post vital signs: Reviewed and stable  Last Vitals:  Vitals Value Taken Time  BP 136/97 01/18/20 1627  Temp 36.8 C 01/18/20 1627  Pulse 74 01/18/20 1632  Resp 20 01/18/20 1632  SpO2 100 % 01/18/20 1632  Vitals shown include unvalidated device data.  Last Pain:  Vitals:   01/18/20 1627  TempSrc:   PainSc: (P) 0-No pain         Complications: No complications documented.

## 2020-01-18 NOTE — Interval H&P Note (Signed)
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  The various methods of treatment have been discussed with the patient and family. After consideration of risks, benefits and other options for treatment, the patient has consented to  Procedure(s): c3-4, c4-5 ANTERIOR CERVICAL DECOMPRESSION/DISCECTOMY FUSION, ALLOGRAFT, PLATE (N/A) as a surgical intervention.  The patient's history has been reviewed, patient examined, no change in status, stable for surgery.  I have reviewed the patient's chart and labs.  Questions were answered to the patient's satisfaction.     Marybelle Killings

## 2020-01-18 NOTE — Op Note (Signed)
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  Postop diagnosis: Same  Procedure: C3-4, C4-5 anterior cervical discectomy and fusion, allograft and plate.  Surgeon: Rodell Perna, MD  Assistant: Benjiman Core, PA-C medically necessary and present for the entire procedure  Anesthesia General anesthesia +6 cc Marcaine local.  Drains 1 Hemovac neck.  Implants [employer] cervical plate 32 mm Xtend plate, 4.2 time 14 mm screws times 6.  8 mm graft times 2.  Procedure: After standard prepping draping of the neck with head alter traction without weight arms tucked at the side with careful padding over the ulnar nerve neck was prepped with DuraPrep.  A pink doughnut ring was used with the head and gel bag transverse underneath the scapula.  Next prepped with DuraPrep the area squared with towels sterile skin marker Betadine Steri-Drape thyroid sheets and drapes a sterile male scan was performed.  Ancef prophylaxis 2 g.  After timeout procedure incision was made starting at the midline extending to the left platysma was split in line with the fibers.  This was at the level of the hyoid bone.  Initial exposure with blunt dissection down to the C4-5 level with a short 25 needle placed confirmed with sterilely draped C arm in the lateral position.  C4-5 was approached first there was hemicord compression with patient having radicular symptoms without definite myelopathy symptoms.  Discectomy was performed operative microscope was used taking down the posterior longitudinal ligament decompressing the cord taking way spurs above and below until the dura was completely decompressed.  Trial sizer shows than a millimeter graft gave good fit it was countersunk 1 mm and was snug.  Identical procedure repeated at the C3-4 level.  Some Surgi-Flo was used for some epidural bleeding.  Decompression removing the posterior longitudinal ligament which in areas was adherent to the  dura.  Sweeps were passed with no areas of compression and dura moved ventrally after being decompressed.  8 mm graft was also appropriate for this level and then a 32 mm plate was selected adjusted under C arm AP and lateral held with a single spike and then holes were filled checked under C arm.  Superior right screw and C3 angle down a little bit close to the graft we backed it up angled it more into the vertebral body after tight fit confirmed with final crosstable lateral C-arm view and then locking down all screws with a tiny screwdriver until it clicked.  Hemovac was placed in and out technique 3-0 Vicryl in the platysma 4-0 Vicryl subcuticular closure of the skin tincture benzoin Steri-Strips 4 x 4's tape and soft collar.  Patient transferred recovery in stable condition.

## 2020-01-18 NOTE — Progress Notes (Signed)
Orthopedic Tech Progress Note Patient Details:  Paul Morales Sep 16, 1975 [id number] Dropped off brace in patient room. Ortho Devices Type of Ortho Device: Soft collar Ortho Device/Splint Location: NECK Ortho Device/Splint Interventions: Ordered   Post Interventions Patient Tolerated: Other (comment) Instructions Provided: Care of device, Adjustment of device   Paul Morales 01/18/2020, 7:33 PM

## 2020-01-18 NOTE — Anesthesia Postprocedure Evaluation (Signed)
Anesthesia Post Note  Patient: Paul Morales  Procedure(s) Performed: c3-4, c4-5 ANTERIOR CERVICAL DECOMPRESSION/DISCECTOMY FUSION, ALLOGRAFT, PLATE (N/A )     Patient location during evaluation: PACU Anesthesia Type: General Level of consciousness: awake and alert, oriented and patient cooperative Pain management: pain level controlled Vital Signs Assessment: post-procedure vital signs reviewed and stable Respiratory status: spontaneous breathing, nonlabored ventilation and respiratory function stable Cardiovascular status: blood pressure returned to baseline and stable Postop Assessment: no apparent nausea or vomiting Anesthetic complications: no   No complications documented.  Last Vitals:  Vitals:   01/18/20 1627 01/18/20 1700  BP: (!) 136/97 (!) 146/105  Pulse: 73 69  Resp: 15 15  Temp: 36.8 C   SpO2: 100% 100%    Last Pain:  Vitals:   01/18/20 1715  TempSrc:   PainSc: [place]

## 2020-01-18 NOTE — Discharge Instructions (Signed)
Ok to shower 5 days postop and must use extra collar provided for showering.     -Do not apply any creams or ointments to incision.    -Do not remove steri-strips.   - Can use 4x4 gauze and tape for dressing changes.    -No aggressive activity.   -Cervical collar must be on at all times including when showering.   -Do not bend or turn neck.    -No driving

## 2020-01-18 NOTE — Anesthesia Procedure Notes (Signed)
Procedure Name: Intubation Date/Time: 01/18/2020 1:54 PM Performed by: Emmakate Hypes T, CRNA Pre-anesthesia Checklist: Patient identified, Emergency Drugs available, Suction available and Patient being monitored Patient Re-evaluated:Patient Re-evaluated prior to induction Oxygen Delivery Method: Circle system utilized Preoxygenation: Pre-oxygenation with 100% oxygen Induction Type: IV induction Ventilation: Mask ventilation without difficulty Laryngoscope Size: Glidescope and 3 Grade View: Grade I Tube type: Oral Tube size: 7.5 mm Number of attempts: 1 Airway Equipment and Method: Stylet,  Oral airway and Video-laryngoscopy Placement Confirmation: ETT inserted through vocal cords under direct vision,  positive ETCO2 and breath sounds checked- equal and bilateral Secured at: 21 cm Tube secured with: Tape Dental Injury: Teeth and Oropharynx as per pre-operative assessment

## 2020-01-18 NOTE — H&P (Signed)
Paul Morales is an 44 y.o. male.   Chief Complaint: Neck pain and upper extremity radiculopathy   HPI: 44 year old black male with history of C3-4 and C4-5 HNP/stenosis comes in for preop evaluation.  Continues have ongoing symptoms and wants to proceed with C3-4 and C4-5 ACDF.  We received preop cardiac clearance.  Today history and physical performed.  Temp 99.2.  Patient denies known fever, chills, cardiac pulmonary GI GU issues.  Patient as previously admitted marijuana use to our surgery scheduler and the cardiologist.  He denies any other illicit drug use.   Past Medical History:  Diagnosis Date  . Anemia   . Crohn's disease ([place])   . Heart murmur    "T9" per patient  . History of kidney stones    surgery to remove  . HSV infection   . IBS (irritable bowel syndrome) 04/08/2016    Past Surgical History:  Procedure Laterality Date  . COLONOSCOPY N/A 12/22/2016   Procedure: COLONOSCOPY;  Surgeon: Rogene Houston, MD;  Location: [facility];  Service: Endoscopy;  Laterality: N/A;  12:00  . LITHOTRIPSY    . VASECTOMY    . WISDOM TOOTH EXTRACTION      Family History  Problem Relation Age of Onset  . Diabetes Mother   . Hypertension Father    Social History:  reports that he has been smoking cigars. He has never used smokeless tobacco. He reports previous alcohol use. He reports current drug use. Drug: Marijuana.  Allergies: No Known Allergies  Medications Prior to Admission  Medication Sig Dispense Refill  . Adalimumab (HUMIRA) 40 MG/0.4ML PSKT Inject 40 mg into the skin every 14 (fourteen) days.     Marland Kitchen dicyclomine (BENTYL) 10 MG capsule Take 10 mg by mouth 4 (four) times daily -  before meals and at bedtime.    . ferrous sulfate 325 (65 FE) MG tablet Take 325 mg by mouth 3 (three) times daily.     Marland Kitchen sulfaSALAzine (AZULFIDINE) 500 MG tablet Take 500 mg by mouth 2 (two) times daily.      Results for orders placed or performed during the hospital encounter of 01/18/20 (from  the past 48 hour(s))  ABO/Rh     Status: None   Collection Time: 01/18/20 11:20 AM  Result Value Ref Range   ABO/RH(D)      A POS Performed at [facility] [address], [place], [place] [address]    No results found.  Review of Systems  Constitutional: Positive for activity change.  HENT: Negative.   Respiratory: Negative.   Cardiovascular: Negative.   Gastrointestinal: Negative.   Musculoskeletal: Positive for neck pain and neck stiffness.  Neurological: Positive for numbness.  Psychiatric/Behavioral: Negative.     Blood pressure (!) 125/98, pulse 63, temperature 98.2 F (36.8 C), temperature source Oral, resp. rate 18, SpO2 100 %. Physical Exam HENT:     Head: Normocephalic.  Eyes:     Extraocular Movements: Extraocular movements intact.     Pupils: Pupils are equal, round, and reactive to light.  Cardiovascular:     Rate and Rhythm: Regular rhythm.  Pulmonary:     Effort: Pulmonary effort is normal. No respiratory distress.  Musculoskeletal:        General: Tenderness present.  Neurological:     Mental Status: He is alert and oriented to person, place, and time.      Assessment/Plan C3-4 and C4-5 HNP/stenosis  We will proceed with C3-4 and C4-5 ACDF is scheduled.  Surgical procedure discussed.  All questions answered.  Benjiman Core, PA-C 01/18/2020, 12:34 PM

## 2020-01-19 ENCOUNTER — Encounter: Payer: Self-pay | Admitting: Orthopaedic Surgery

## 2020-01-19 DIAGNOSIS — M4802 Spinal stenosis, cervical region: Secondary | ICD-10-CM | POA: Diagnosis not present

## 2020-01-19 NOTE — Progress Notes (Signed)
Pt doing well. Pt and wife given D/C instructions with verbal understanding. Rx's were sent to the pharmacy by MD. Pt's incision is clean and dry with no sign of infection. Pt's IV and drain were removed prior to D/C. Pt D/C'd home via wheelchair per MD order. Pt is stable @ D/C and has no other needs at this time. Holli Humbles, RN

## 2020-01-19 NOTE — Evaluation (Signed)
Occupational Therapy Evaluation and Discharge Patient Details Name: Paul Morales MRN: [id number] DOB: 1975/10/10 Today's Date: 01/19/2020    History of Present Illness Pt admitted for C 3-4, 4-5 ACDF. PMH: Crohns disease, anemia.    Clinical Impression   All education completed, reinforced with written handout. Pt verbalized understanding. He has support of his wife at home for IADL. No further OT needs.    Follow Up Recommendations  No OT follow up    Equipment Recommendations  None recommended by OT    Recommendations for Other Services       Precautions / Restrictions Precautions Precautions: Cervical Precaution Booklet Issued: Yes (comment) Precaution Comments: reviewed cervical precautions with pt and reinforced with written handout Required Braces or Orthoses: Cervical Brace Cervical Brace: Soft collar      Mobility Bed Mobility Overal bed mobility: Modified Independent             General bed mobility comments: educated in log roll technique  Transfers Overall transfer level: Independent                    Balance                                           ADL either performed or assessed with clinical judgement   ADL Overall ADL's : Modified independent                                       General ADL Comments: Pt instructed in compensatory strategies for ADL adhering to cervical precautions and activities to avoid.     Vision Patient Visual Report: No change from baseline       Perception     Praxis      Pertinent Vitals/Pain Pain Assessment: Faces Faces Pain Scale: Hurts a little bit Pain Location: neck Pain Descriptors / Indicators: Sore Pain Intervention(s): Monitored during session     Hand Dominance Right   Extremity/Trunk Assessment Upper Extremity Assessment Upper Extremity Assessment: Overall WFL for tasks assessed   Lower Extremity Assessment Lower Extremity Assessment: Overall  WFL for tasks assessed       Communication Communication Communication: No difficulties   Cognition Arousal/Alertness: Awake/alert Behavior During Therapy: WFL for tasks assessed/performed Overall Cognitive Status: Within Functional Limits for tasks assessed                                     General Comments       Exercises     Shoulder Instructions      Home Living Family/patient expects to be discharged to:: Private residence Living Arrangements: Spouse/significant other;Children Available Help at Discharge: Family                                    Prior Functioning/Environment Level of Independence: Independent                 OT Problem List:        OT Treatment/Interventions:      OT Goals(Current goals can be found in the care plan section)    OT Frequency:     Barriers to  D/C:            Co-evaluation              AM-PAC OT "6 Clicks" Daily Activity     Outcome Measure Help from another person eating meals?: None Help from another person taking care of personal grooming?: None Help from another person toileting, which includes using toliet, bedpan, or urinal?: None Help from another person bathing (including washing, rinsing, drying)?: None Help from another person to put on and taking off regular upper body clothing?: None Help from another person to put on and taking off regular lower body clothing?: None 6 Click Score: 24   End of Session    Activity Tolerance: Patient tolerated treatment well Patient left: in bed;with call bell/phone within reach;with family/visitor present  OT Visit Diagnosis: Pain                Time: 0812-0829 OT Time Calculation (min): 17 min Charges:  OT General Charges $OT Visit: 1 Visit OT Evaluation $OT Eval Low Complexity: 1 Low  Paul Morales, OTR/L Acute Rehabilitation Services Pager: [phone] Office: [phone]  Paul Morales 01/19/2020, 9:10 AM

## 2020-01-19 NOTE — Progress Notes (Signed)
Subjective: Patient doing well.  Pain controlled.  No complaints.   Objective: Vital signs in last 24 hours: Temp:  [98.1 F (36.7 C)-98.5 F (36.9 C)] 98.5 F (36.9 C) (07/15 0740) Pulse Rate:  [51-74] 51 (07/15 0740) Resp:  [15-20] 16 (07/15 0740) BP: (125-157)/(94-106) 131/94 (07/15 0740) SpO2:  [100 %] 100 % (07/15 0740) Weight:  [53.6 kg] 53.6 kg (07/14 1825)  Intake/Output from previous day: 07/14 0701 - 07/15 0700 In: 1400 [I.V.:1400] Out: 140 [Drains:40; Blood:100] Intake/Output this shift: No intake/output data recorded.  No results for input(s): HGB in the last 72 hours. No results for input(s): WBC, RBC, HCT, PLT in the last 72 hours. No results for input(s): NA, K, CL, CO2, BUN, CREATININE, GLUCOSE, CALCIUM in the last 72 hours. No results for input(s): LABPT, INR in the last 72 hours.  Exam Wound looks good.  Minimal swelling.  Drain removed.  Neurologically intact.    Assessment/Plan: We will discharge patient home today.  Sent in prescriptions for Percocet and Robaxin to his pharmacy.  Cervical collar on at all times.  Did discuss the risk of pseudoarthrosis if he is noncompliant.  He voiced understanding.  Follow-up with Dr. Lorin Mercy next week in the [facility] clinic.   Benjiman Core 01/19/2020, 10:20 AM

## 2020-01-19 NOTE — Progress Notes (Deleted)
   Subjective: 1 Day Post-Op Procedure(s) (LRB): c3-4, c4-5 ANTERIOR CERVICAL DECOMPRESSION/DISCECTOMY FUSION, ALLOGRAFT, PLATE (N/A) Patient reports pain as mild.    Objective: Vital signs in last 24 hours: Temp:  [98.1 F (36.7 C)-98.5 F (36.9 C)] 98.5 F (36.9 C) (07/15 0740) Pulse Rate:  [51-74] 51 (07/15 0740) Resp:  [15-20] 16 (07/15 0740) BP: (125-157)/(94-106) 131/94 (07/15 0740) SpO2:  [100 %] 100 % (07/15 0740) Weight:  [53.6 kg] 53.6 kg (07/14 1825)  Intake/Output from previous day: 07/14 0701 - 07/15 0700 In: 1400 [I.V.:1400] Out: 140 [Drains:40; Blood:100] Intake/Output this shift: No intake/output data recorded.  No results for input(s): HGB in the last 72 hours. No results for input(s): WBC, RBC, HCT, PLT in the last 72 hours. No results for input(s): NA, K, CL, CO2, BUN, CREATININE, GLUCOSE, CALCIUM in the last 72 hours. No results for input(s): LABPT, INR in the last 72 hours.  Neurologically intact DG Cervical Spine 2-3 Views  Result Date: 01/18/2020 CLINICAL DATA:  ACDF. EXAM: CERVICAL SPINE - 2-3 VIEW COMPARISON:  None. FINDINGS: Two fluoroscopic spot images in the operating room provided in frontal and lateral projections. Anterior fusion hardware at C3 through C5 with interbody spacers. Total fluoroscopy time 11 seconds. Dose 0.72 mGy. IMPRESSION: Intraoperative fluoroscopy during C3-C5 ACDF. Electronically Signed   By: Keith Rake M.D.   On: 01/18/2020 18:26   DG C-Arm 1-60 Min  Result Date: 01/18/2020 CLINICAL DATA:  ACDF. EXAM: DG C-ARM 1-60 MIN FLUOROSCOPY TIME:  Fluoroscopy Time:  11 seconds Radiation Exposure Index (if provided by the fluoroscopic device): 0.72 mGy Number of Acquired Spot Images: 2 COMPARISON:  None. FINDINGS: Two fluoroscopic spot images in the operating room provided in frontal and lateral projections. Anterior fusion hardware at C3 through C5 with interbody spacers. Total fluoroscopy time 11 seconds. Dose 0.72 mGy. IMPRESSION:  Intraoperative fluoroscopy during C3-C5 ACDF. Electronically Signed   By: Keith Rake M.D.   On: 01/18/2020 18:31    Assessment/Plan: 1 Day Post-Op Procedure(s) (LRB): c3-4, c4-5 ANTERIOR CERVICAL DECOMPRESSION/DISCECTOMY FUSION, ALLOGRAFT, PLATE (N/A) Up with therapy 50 % WB. She has husband at home and should be able to be discharged home after a couple days of therapy to make sure she can ambulate safely. ASA 325 mg po time 4 wks.  Rx sent to her pharmacy for pain.   Marybelle Killings 01/19/2020, 7:45 AM

## 2020-01-20 ENCOUNTER — Encounter (HOSPITAL_COMMUNITY): Payer: Self-pay | Admitting: Orthopaedic Surgery

## 2020-01-20 NOTE — Discharge Summary (Signed)
Patient ID: Paul Morales MRN: [id number] DOB/AGE: 1975-09-30 44 y.o.  Admit date: 01/18/2020 Discharge date: 01/20/2020  Admission Diagnoses:  Active Problems:   Spinal stenosis of cervical region   HNP (herniated nucleus pulposus), cervical   Discharge Diagnoses:  Active Problems:   Spinal stenosis of cervical region   HNP (herniated nucleus pulposus), cervical  status post Procedure(s): c3-4, c4-5 ANTERIOR CERVICAL DECOMPRESSION/DISCECTOMY FUSION, ALLOGRAFT, PLATE  Past Medical History:  Diagnosis Date  . Anemia   . Crohn's disease ([place])   . Heart murmur    "T9" per patient  . History of kidney stones    surgery to remove  . HSV infection   . IBS (irritable bowel syndrome) 04/08/2016    Surgeries: Procedure(s): c3-4, c4-5 ANTERIOR CERVICAL DECOMPRESSION/DISCECTOMY FUSION, ALLOGRAFT, PLATE on 9/93/7169   Consultants:   Discharged Condition: Improved  Hospital Course: Paul Morales is an 44 y.o. male who was admitted 01/18/2020 for operative treatment of cervical HNP/stenosis. Patient failed conservative treatments (please see the history and physical for the specifics) and had severe unremitting pain that affects sleep, daily activities and work/hobbies. After pre-op clearance, the patient was taken to the operating room on 01/18/2020 and underwent  Procedure(s): c3-4, c4-5 ANTERIOR CERVICAL DECOMPRESSION/DISCECTOMY FUSION, ALLOGRAFT, PLATE.    Patient was given perioperative antibiotics:  Anti-infectives (From admission, onward)   Start     Dose/Rate Route Frequency Ordered Stop   01/18/20 2200  ceFAZolin (ANCEF) IVPB 1 g/50 mL premix        1 g 100 mL/hr over 30 Minutes Intravenous Every 8 hours 01/18/20 1809 01/19/20 0553   01/18/20 1045  ceFAZolin (ANCEF) IVPB 2g/100 mL premix        2 g 200 mL/hr over 30 Minutes Intravenous On call to O.R. 01/18/20 1040 01/18/20 1358       Patient was given sequential compression devices and early ambulation to prevent DVT.    Patient benefited maximally from hospital stay and there were no complications. At the time of discharge, the patient was urinating/moving their bowels without difficulty, tolerating a regular diet, pain is controlled with oral pain medications and they have been cleared by PT/OT.   Recent vital signs: No data found.   Recent laboratory studies: No results for input(s): WBC, HGB, HCT, PLT, NA, K, CL, CO2, BUN, CREATININE, GLUCOSE, INR, CALCIUM in the last 72 hours.  Invalid input(s): PT, 2   Discharge Medications:   Allergies as of 01/19/2020   No Known Allergies     Medication List    TAKE these medications   dicyclomine 10 MG capsule Commonly known as: BENTYL Take 10 mg by mouth 4 (four) times daily -  before meals and at bedtime.   ferrous sulfate 325 (65 FE) MG tablet Take 325 mg by mouth 3 (three) times daily.   Humira 40 MG/0.4ML Pskt Generic drug: Adalimumab Inject 40 mg into the skin every 14 (fourteen) days.   methocarbamol 500 MG tablet Commonly known as: Robaxin Take 1 tablet (500 mg total) by mouth every 6 (six) hours as needed for muscle spasms.   oxyCODONE-acetaminophen 5-325 MG tablet Commonly known as: PERCOCET/ROXICET Take 1 tablet by mouth every 4 (four) hours as needed for severe pain.   sulfaSALAzine 500 MG tablet Commonly known as: AZULFIDINE Take 500 mg by mouth 2 (two) times daily.       Diagnostic Studies: DG Chest 2 View  Result Date: 01/11/2020 CLINICAL DATA:  44 year old male with preop radiograph for cervical fusion. EXAM: CHEST -  2 VIEW COMPARISON:  None. FINDINGS: Left lung base linear density, likely atelectasis/scarring. There is background of chronic interstitial coarsening and probable mild emphysema. Increased density at the left lung base, seen on the lateral view with silhouetting of the posterior left hemidiaphragm may represent atelectasis or infiltrate. No large pleural effusion or pneumothorax. The cardiac silhouette is within  limits. No acute osseous pathology. IMPRESSION: Left lung base atelectasis versus infiltrate. Electronically Signed   By: Anner Crete M.D.   On: 01/11/2020 22:14   DG Cervical Spine 2-3 Views  Result Date: 01/18/2020 CLINICAL DATA:  ACDF. EXAM: CERVICAL SPINE - 2-3 VIEW COMPARISON:  None. FINDINGS: Two fluoroscopic spot images in the operating room provided in frontal and lateral projections. Anterior fusion hardware at C3 through C5 with interbody spacers. Total fluoroscopy time 11 seconds. Dose 0.72 mGy. IMPRESSION: Intraoperative fluoroscopy during C3-C5 ACDF. Electronically Signed   By: Keith Rake M.D.   On: 01/18/2020 18:26   DG C-Arm 1-60 Min  Result Date: 01/18/2020 CLINICAL DATA:  ACDF. EXAM: DG C-ARM 1-60 MIN FLUOROSCOPY TIME:  Fluoroscopy Time:  11 seconds Radiation Exposure Index (if provided by the fluoroscopic device): 0.72 mGy Number of Acquired Spot Images: 2 COMPARISON:  None. FINDINGS: Two fluoroscopic spot images in the operating room provided in frontal and lateral projections. Anterior fusion hardware at C3 through C5 with interbody spacers. Total fluoroscopy time 11 seconds. Dose 0.72 mGy. IMPRESSION: Intraoperative fluoroscopy during C3-C5 ACDF. Electronically Signed   By: Keith Rake M.D.   On: 01/18/2020 18:31   ECHOCARDIOGRAM COMPLETE  Result Date: 01/03/2020    ECHOCARDIOGRAM REPORT   Patient Name:   Paul Morales Date of Exam: 01/03/2020 Medical Rec #:  [id number]    Height:       65.0 in Accession #:    [id number]   Weight:       123.4 lb Date of Birth:  1975/12/11    BSA:          1.611 m Patient Age:    81 years     BP:           123/85 mmHg Patient Gender: M            HR:           87 bpm. Exam Location:  [facility] Procedure: 2D Echo, Cardiac Doppler and Color Doppler Indications:    Murmur 785.2 / R01.1  History:        Patient has no prior history of Echocardiogram examinations.                 Signs/Symptoms:Murmur; Risk Factors:Current Smoker.   Sonographer:    Alvino Chapel RCS Referring Phys: [place]  1. Left ventricular ejection fraction, by estimation, is 60 to 65%. The left ventricle has normal function. The left ventricle has no regional wall motion abnormalities. There is mild left ventricular hypertrophy. Left ventricular diastolic parameters were normal.  2. Right ventricular systolic function is normal. The right ventricular size is normal. There is normal pulmonary artery systolic pressure.  3. Left atrial size was moderately dilated.  4. The mitral valve is normal in structure. No evidence of mitral valve regurgitation. No evidence of mitral stenosis.  5. The aortic valve is tricuspid. Aortic valve regurgitation is not visualized. No aortic stenosis is present.  6. The inferior vena cava is normal in size with greater than 50% respiratory variability, suggesting right atrial pressure of 3 mmHg. FINDINGS  Left Ventricle:  Left ventricular ejection fraction, by estimation, is 60 to 65%. The left ventricle has normal function. The left ventricle has no regional wall motion abnormalities. The left ventricular internal cavity size was normal in size. There is  mild left ventricular hypertrophy. Left ventricular diastolic parameters were normal. Right Ventricle: The right ventricular size is normal. No increase in right ventricular wall thickness. Right ventricular systolic function is normal. There is normal pulmonary artery systolic pressure. The tricuspid regurgitant velocity is 1.74 m/s, and  with an assumed right atrial pressure of 3 mmHg, the estimated right ventricular systolic pressure is 88.8 mmHg. Left Atrium: Left atrial size was moderately dilated. Right Atrium: Right atrial size was normal in size. Pericardium: There is no evidence of pericardial effusion. Mitral Valve: The mitral valve is normal in structure. No evidence of mitral valve regurgitation. No evidence of mitral valve stenosis. Tricuspid Valve: No  evidence of pulmonary HTN, PASP is 15 mmHg. The tricuspid valve is normal in structure. Tricuspid valve regurgitation is mild . No evidence of tricuspid stenosis. Aortic Valve: The aortic valve is tricuspid. . There is mild thickening and mild calcification of the aortic valve. Aortic valve regurgitation is not visualized. No aortic stenosis is present. Mild aortic valve annular calcification. There is mild thickening of the aortic valve. There is mild calcification of the aortic valve. Aortic valve mean gradient measures 6.4 mmHg. Aortic valve peak gradient measures 13.3 mmHg. Aortic valve area, by VTI measures 2.07 cm. Pulmonic Valve: The pulmonic valve was not well visualized. Pulmonic valve regurgitation is not visualized. No evidence of pulmonic stenosis. Aorta: The aortic root is normal in size and structure. Venous: The inferior vena cava is normal in size with greater than 50% respiratory variability, suggesting right atrial pressure of 3 mmHg. IAS/Shunts: No atrial level shunt detected by color flow Doppler.  LEFT VENTRICLE PLAX 2D LVIDd:         4.65 cm  Diastology LVIDs:         3.00 cm  LV e' lateral:   9.25 cm/s LV PW:         1.04 cm  LV E/e' lateral: 7.2 LV IVS:        1.12 cm  LV e' medial:    7.40 cm/s LVOT diam:     1.90 cm  LV E/e' medial:  9.0 LV SV:         66 LV SV Index:   41 LVOT Area:     2.84 cm  RIGHT VENTRICLE RV S prime:     13.10 cm/s TAPSE (M-mode): 1.8 cm LEFT ATRIUM             Index       RIGHT ATRIUM           Index LA diam:        2.60 cm 1.61 cm/m  RA Area:     14.70 cm LA Vol (A2C):   58.7 ml 36.43 ml/m RA Volume:   40.80 ml  25.32 ml/m LA Vol (A4C):   46.4 ml 28.80 ml/m LA Biplane Vol: 57.9 ml 35.94 ml/m  AORTIC VALVE AV Area (Vmax):    1.90 cm AV Area (Vmean):   1.89 cm AV Area (VTI):     2.07 cm AV Vmax:           182.43 cm/s AV Vmean:          115.787 cm/s AV VTI:  0.318 m AV Peak Grad:      13.3 mmHg AV Mean Grad:      6.4 mmHg LVOT Vmax:         122.00  cm/s LVOT Vmean:        77.000 cm/s LVOT VTI:          0.232 m LVOT/AV VTI ratio: 0.73  AORTA Ao Root diam: 3.10 cm MITRAL VALVE               TRICUSPID VALVE MV Area (PHT): 2.91 cm    TR Peak grad:   12.1 mmHg MV Decel Time: 261 msec    TR Vmax:        174.00 cm/s MV E velocity: 66.40 cm/s MV A velocity: 63.00 cm/s  SHUNTS MV E/A ratio:  1.05        Systemic VTI:  0.23 m                            Systemic Diam: 1.90 cm Carlyle Dolly MD Electronically signed by Carlyle Dolly MD Signature Date/Time: 01/03/2020/11:57:44 AM    Final     Discharge Instructions    Incentive spirometry RT   Complete by: As directed        Follow-up Information    Schedule an appointment as soon as possible for a visit with Marybelle Killings, MD.   Specialty: Orthopedic Surgery Why: schedule return office visit with dr Lorin Mercy one week postop Contact information: [place] [address] [phone]               Discharge Plan:  discharge to home  Disposition:     Signed: Benjiman Core  01/20/2020, 3:25 PM

## 2020-01-26 ENCOUNTER — Ambulatory Visit (INDEPENDENT_AMBULATORY_CARE_PROVIDER_SITE_OTHER): Payer: PRIVATE HEALTH INSURANCE

## 2020-01-26 ENCOUNTER — Ambulatory Visit (INDEPENDENT_AMBULATORY_CARE_PROVIDER_SITE_OTHER): Payer: PRIVATE HEALTH INSURANCE | Admitting: Orthopaedic Surgery

## 2020-01-26 ENCOUNTER — Other Ambulatory Visit: Payer: Self-pay

## 2020-01-26 ENCOUNTER — Encounter: Payer: Self-pay | Admitting: Orthopaedic Surgery

## 2020-01-26 VITALS — Ht 65.0 in | Wt 118.0 lb

## 2020-01-26 DIAGNOSIS — Z981 Arthrodesis status: Secondary | ICD-10-CM

## 2020-01-26 NOTE — Progress Notes (Signed)
   Post-Op Visit Note   Patient: Paul Morales           Date of Birth: March 18, 1976           MRN: [id number] Visit Date: 01/26/2020 PCP: Curlene Labrum, MD   Assessment & Plan: Patient returns 1 week post two-level cervical fusion C3-C5.  He notes the pills sometimes tends to get stuck in his throat with some difficulty swallowing.  Incision looks good Steri-Strips changed good relief of preop problems in his left arm.  He is notes improvement in strength in his right arm and less numbness.  Chief Complaint:  Chief Complaint  Patient presents with  . Neck - Routine Post Op    01/18/2020 C3-4, C4-5 ACDF   Visit Diagnoses:  1. Status post cervical spinal fusion     Plan: Return 5 weeks lateral flexion-extension x-ray on return.  He is happy with the surgical result work slip given stating patient had cervical fusion 01/18/2020 and he is out of work x6 weeks from today.  Follow-Up Instructions: No follow-ups on file.   Orders:  Orders Placed This Encounter  Procedures  . XR Cervical Spine 2 or 3 views   No orders of the defined types were placed in this encounter.   Imaging: No results found.  PMFS History: Patient Active Problem List   Diagnosis Date Noted  . HNP (herniated nucleus pulposus), cervical 01/18/2020  . Spinal stenosis of cervical region 08/08/2018  . 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 Enteritis 12/04/2016  . IBS (irritable bowel syndrome) 04/08/2016   Past Medical History:  Diagnosis Date  . Anemia   . Crohn's disease ([place])   . Heart murmur    "T9" per patient  . History of kidney stones    surgery to remove  . HSV infection   . IBS (irritable bowel syndrome) 04/08/2016    Family History  Problem Relation Age of Onset  . Diabetes Mother   . Hypertension Father     Past Surgical History:  Procedure Laterality Date  . ANTERIOR CERVICAL DECOMP/DISCECTOMY FUSION  N/A 01/18/2020   Procedure: c3-4, c4-5 ANTERIOR CERVICAL DECOMPRESSION/DISCECTOMY FUSION, ALLOGRAFT, PLATE;  Surgeon: Marybelle Killings, MD;  Location: [facility];  Service: Orthopedics;  Laterality: N/A;  . COLONOSCOPY N/A 12/22/2016   Procedure: COLONOSCOPY;  Surgeon: Rogene Houston, MD;  [facility];  Service: Endoscopy;  Laterality: N/A;  12:00  . LITHOTRIPSY    . VASECTOMY    . WISDOM TOOTH EXTRACTION     Social History   Occupational History  . Not on file  Tobacco Use  . Smoking status: Current Every Day Smoker    Types: Cigars  . Smokeless tobacco: Never Used  . Tobacco comment: 3-4 cigars per day  Vaping Use  . Vaping Use: Never used  Substance and Sexual Activity  . Alcohol use: Not Currently    Comment: occasionally   . Drug use: Yes    Types: Marijuana    Comment: per patient "smoke constantly, everyday of the week", last use 01/11/20  . Sexual activity: Not Currently

## 2020-01-31 ENCOUNTER — Telehealth: Payer: Self-pay | Admitting: Radiology

## 2020-01-31 NOTE — Telephone Encounter (Signed)
Paul Morales called and wants to know what his restrictions are and is he able to get in bed now or does he still need to sleep propped up?  Please call him at [phone] or [phone]   Thanks!   Please advise.

## 2020-01-31 NOTE — Telephone Encounter (Signed)
I called discussed.

## 2020-02-13 ENCOUNTER — Telehealth: Payer: Self-pay | Admitting: Radiology

## 2020-02-13 NOTE — Telephone Encounter (Signed)
Patient left message with [facility] office last week while I was out of the office stating that his pain level is only 2/10, however, he is experiencing some numbness under his jaw. He would like to make sure this is normal.  CB for patient is [phone]

## 2020-02-13 NOTE — Telephone Encounter (Signed)
I called discussed.  Already resolving. Likely from traction intra op

## 2020-03-01 ENCOUNTER — Encounter: Payer: Self-pay | Admitting: Orthopaedic Surgery

## 2020-03-01 ENCOUNTER — Ambulatory Visit (INDEPENDENT_AMBULATORY_CARE_PROVIDER_SITE_OTHER): Payer: PRIVATE HEALTH INSURANCE

## 2020-03-01 ENCOUNTER — Other Ambulatory Visit: Payer: Self-pay

## 2020-03-01 ENCOUNTER — Ambulatory Visit (INDEPENDENT_AMBULATORY_CARE_PROVIDER_SITE_OTHER): Payer: PRIVATE HEALTH INSURANCE | Admitting: Orthopaedic Surgery

## 2020-03-01 VITALS — Ht 65.0 in | Wt 118.0 lb

## 2020-03-01 DIAGNOSIS — Z981 Arthrodesis status: Secondary | ICD-10-CM | POA: Diagnosis not present

## 2020-03-01 NOTE — Progress Notes (Signed)
   Post-Op Visit Note   Patient: Paul Morales           Date of Birth: [birth year]           MRN: [id number] Visit Date: 03/01/2020 PCP: Curlene Labrum, MD   Assessment & Plan: X-rays look good incision looks good he can discontinue his collar in a week.  Gradually work on arm strengthening and he can resume work on 03/19/2020 work slip given.  Patient is very happy that he is gotten good pain relief and is noticing improvement in his strength week to week.  Chief Complaint:  Chief Complaint  Patient presents with  . Neck - Follow-up    01/18/2020 C3-4,C4-5 ACDF   Visit Diagnoses:  1. Status post cervical spinal fusion     Plan: Resume work 03/19/2020.  Return as needed.  Follow-Up Instructions: No follow-ups on file.   Orders:  Orders Placed This Encounter  Procedures  . XR Cervical Spine 2 or 3 views   No orders of the defined types were placed in this encounter.   Imaging: No results found.  PMFS History: Patient Active Problem List   Diagnosis Date Noted  . HNP (herniated nucleus pulposus), cervical 01/18/2020  . Spinal stenosis of cervical region 08/08/2018  . 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 Enteritis 12/04/2016  . IBS (irritable bowel syndrome) 04/08/2016   Past Medical History:  Diagnosis Date  . Anemia   . Crohn's disease ([place])   . Heart murmur    "T9" per patient  . History of kidney stones    surgery to remove  . HSV infection   . IBS (irritable bowel syndrome) 04/08/2016    Family History  Problem Relation Age of Onset  . Diabetes Mother   . Hypertension Father     Past Surgical History:  Procedure Laterality Date  . ANTERIOR CERVICAL DECOMP/DISCECTOMY FUSION N/A 01/18/2020   Procedure: c3-4, c4-5 ANTERIOR CERVICAL DECOMPRESSION/DISCECTOMY FUSION, ALLOGRAFT, PLATE;  Surgeon: Marybelle Killings, MD;  Location: [facility];  Service: Orthopedics;  Laterality: N/A;  .  COLONOSCOPY N/A 12/22/2016   Procedure: COLONOSCOPY;  Surgeon: Rogene Houston, MD;  [facility];  Service: Endoscopy;  Laterality: N/A;  12:00  . LITHOTRIPSY    . VASECTOMY    . WISDOM TOOTH EXTRACTION     Social History   Occupational History  . Not on file  Tobacco Use  . Smoking status: Current Every Day Smoker    Types: Cigars  . Smokeless tobacco: Never Used  . Tobacco comment: 3-4 cigars per day  Vaping Use  . Vaping Use: Never used  Substance and Sexual Activity  . Alcohol use: Not Currently    Comment: occasionally   . Drug use: Yes    Types: Marijuana    Comment: per patient "smoke constantly, everyday of the week", last use 01/11/20  . Sexual activity: Not Currently

## 2020-03-19 ENCOUNTER — Telehealth: Payer: Self-pay | Admitting: Radiology

## 2020-03-19 NOTE — Telephone Encounter (Signed)
FYI  Please see below note in email sent from [place] in [place] office.   Paul Morales called and he went back to work today, full duty, and he did GREAT!  He wanted to thank you and Dr Lorin Mercy for everything yall did for him.  He can do heavy lfting with no problem.

## 2020-04-24 ENCOUNTER — Institutional Professional Consult (permissible substitution): Payer: PRIVATE HEALTH INSURANCE | Admitting: Internal Medicine

## 2020-06-05 ENCOUNTER — Encounter: Payer: PRIVATE HEALTH INSURANCE | Admitting: Internal Medicine

## 2020-06-05 ENCOUNTER — Other Ambulatory Visit: Payer: Self-pay

## 2020-06-05 ENCOUNTER — Encounter: Payer: Self-pay | Admitting: Internal Medicine

## 2020-06-13 ENCOUNTER — Encounter (HOSPITAL_COMMUNITY): Payer: Self-pay

## 2020-06-13 ENCOUNTER — Ambulatory Visit (HOSPITAL_COMMUNITY): Payer: PRIVATE HEALTH INSURANCE | Attending: Physician Assistant

## 2020-06-13 ENCOUNTER — Other Ambulatory Visit: Payer: Self-pay

## 2020-06-13 DIAGNOSIS — R29898 Other symptoms and signs involving the musculoskeletal system: Secondary | ICD-10-CM | POA: Insufficient documentation

## 2020-06-13 DIAGNOSIS — M25511 Pain in right shoulder: Secondary | ICD-10-CM | POA: Insufficient documentation

## 2020-06-13 NOTE — Patient Instructions (Signed)
Pec Trigger Point Release with Movement  Place ball below between pec muscle and front of shoulder. Movement should only occur to find the tender location. THEN, keep pressure on tender spot while moving arm into different positions to enhance the stretch.    HOT PACK  Apply a hot pack to the affected area. Apply heat to help relax tight muscles. Apply for 15-20 minutes.     Complete the following exercises 2-3 times a day.  Doorway Stretch  Place each hand opposite each other on the doorway. (You can change where you feel the stretch by moving arms higher or lower.) Step through with one foot and bend front knee until a stretch is felt and hold. Step through with the opposite foot on the next rep. Hold for __10-15___ seconds. Repeat __2__times.     Scapular Retraction (Standing)   With arms at sides, pinch shoulder blades together. Repeat __10__ times per set. Do __1__ sets per session. Do __2__ sessions per day.  http://orth.exer.us/944   Copyright  VHI. All rights reserved.   Internal Rotation Across Back  Grab the end of a towel with your affected side, palm facing backwards. Grab the towel with your unaffected side and pull your affected hand across your back until you feel a stretch in the front of your shoulder. If you feel pain, pull just to the pain, do not pull through the pain. Hold. Return your affected arm to your side. Try to keep your hand/arm close to your body during the entire movement.     Hold for 10-15 seconds. Complete 2 times.      OR    Posterior Capsule Stretch   Stand or sit, one arm across body so hand rests over opposite shoulder. Gently push on crossed elbow with other hand until stretch is felt in shoulder of crossed arm. Hold _10-15__ seconds.  Repeat _2__ times per session. Do ___ sessions per day.   Wall Flexion  Slide your arm up the wall or door frame until a stretch is felt in your shoulder . Hold for 10-15 seconds. Complete 2  times

## 2020-06-14 NOTE — Therapy (Signed)
[place], [name], [address] Phone: [phone]   Fax:  [phone]  Occupational Therapy Evaluation  Patient Details  Name: Paul Morales MRN: [id number] Date of Birth: Nov 04, 1975 Referring Provider (OT): Clemmie Krill, [place]   Encounter Date: 06/13/2020   OT End of Session - 06/14/20 0912    Visit Number 1    Number of Visits 6    Date for OT Re-Evaluation 07/05/20    Authorization Type AMBETTER - pt pays premium monthly for coverage.    Authorization Time Period the month of december (may change insurance in the new year. Will let us know if treatment is to extend into January). 30 visits limit.    Authorization - Number of Visits 1    Progress Note Due on Visit 30    OT Start Time 1600    OT Stop Time 1648    OT Time Calculation (min) 48 min           Past Medical History:  Diagnosis Date  . Anemia   . Crohn's disease ([place])   . Heart murmur    "T9" per patient  . History of kidney stones    surgery to remove  . HSV infection   . IBS (irritable bowel syndrome) 04/08/2016    Past Surgical History:  Procedure Laterality Date  . ANTERIOR CERVICAL DECOMP/DISCECTOMY FUSION N/A 01/18/2020   Procedure: c3-4, c4-5 ANTERIOR CERVICAL DECOMPRESSION/DISCECTOMY FUSION, ALLOGRAFT, PLATE;  Surgeon: Marybelle Killings, MD;  Location: [facility];  Service: Orthopedics;  Laterality: N/A;  . COLONOSCOPY N/A 12/22/2016   Procedure: COLONOSCOPY;  Surgeon: Rogene Houston, MD;  [facility];  Service: Endoscopy;  Laterality: N/A;  12:00  . LITHOTRIPSY    . VASECTOMY    . WISDOM TOOTH EXTRACTION      There were no vitals filed for this visit.   Subjective Assessment - 06/13/20 1610    Subjective  S: I went back to work at 100% after being off for my neck surgery. It was too much all at once.    Pertinent History Patient is a 44 y/o male S/P right shoulder pain which began shortly after returning to work on  03/19/20 from undergoing a C3-4 and C4-5 cervical fusion on 01/18/20. Patient voices that he feels he returned to work without any limitations or restrictions and was required to complete all aspects of his job which is physically demanding requiring excessive reaching above his head, lifting 50+ lbs, and reaching away from his base of support. Both right and left shoulders are hurting. Clemmie Krill PA-C has referred patient to occupational therapy for evaluation and treatment.    Patient Stated Goals To decrease pain in right shoulder.    Currently in Pain? Yes    Pain Score 2    pt reports that pain may reach a 10/10.   Pain Location Shoulder    Pain Orientation Right    Pain Descriptors / Indicators Sore;Throbbing;Constant    Pain Type Acute pain    Pain Radiating Towards Entire shoulder    Pain Frequency Constant    Aggravating Factors  when having to reach above shoulder height at work, heavy lifting and any forward shoulder work.    Pain Relieving Factors massage area    Effect of Pain on Daily Activities when it's a 10/10 it'll prevent him from doing what he needs to do. at a 2/10 no effect  [facility] OT Assessment - 06/13/20 1615      Assessment   Medical Diagnosis right shoulder pain    Referring Provider (OT) Clemmie Krill, PA-C    Onset Date/Surgical Date 03/19/20    Hand Dominance Right    Prior Therapy None      Precautions   Precautions None      Restrictions   Weight Bearing Restrictions No      Balance Screen   Has the patient fallen in the past 6 months No      Home  Environment   Family/patient expects to be discharged to: Private residence      Prior Function   Level of Independence Independent    Vocation Full time employment    Vocation Requirements Works the [employer] for [employer]. Job is physically demanding requiring lifting 50+ lbs, reaching high above shoulder to work for an extended amount of time, frequently reaching out away from  body while working.      ADL   ADL comments No difficulty completing any tasks. Reports pain and discomfort overall. Feels worse if he doesn't work his shoulder versus if he lets it rest.      Mobility   Mobility Status Independent      Written Expression   Dominant Hand Right      Vision - History   Baseline Vision No visual deficits      Cognition   Overall Cognitive Status Within Functional Limits for tasks assessed      Observation/Other Assessments   Focus on Therapeutic Outcomes (FOTO)  Complete UEFI next session versus FOTO      Posture/Postural Control   Posture/Postural Control Postural limitations    Postural Limitations Rounded Shoulders      ROM / Strength   AROM / PROM / Strength AROM;PROM;Strength      Palpation   Palpation comment Max fascial restrictions, trigger points, and tenderness in right upper arm, upper trapezius, and scapularis region      AROM   Overall AROM  Within functional limits for tasks performed    Overall AROM Comments Full A/ROM bilaterally in shoulder in all ranges while seated/standing.    AROM Assessment Site --    Right/Left Shoulder --      PROM   Overall PROM  Within functional limits for tasks performed    Overall PROM Comments RUE shoulder in all ranges.      Strength   Overall Strength Comments Assessed seated. IR/er abducted    Strength Assessment Site Shoulder    Right/Left Shoulder Right;Left    Right Shoulder Flexion 5/5    Right Shoulder ABduction 5/5    Right Shoulder Internal Rotation 5/5    Right Shoulder External Rotation 5/5    Left Shoulder Flexion 4+/5    Left Shoulder Extension 5/5    Left Shoulder ABduction 5/5    Left Shoulder Internal Rotation 5/5    Left Shoulder External Rotation 5/5                    OT Treatments/Exercises (OP) - 06/14/20 0910      Modalities   Modalities Moist Heat      Moist Heat Therapy   Number Minutes Moist Heat 10 Minutes    Moist Heat Location Shoulder                  OT Education - 06/14/20 0910    Education Details Discussed findings of evaluation and treatment plan. Encouraged  looking at task modifications at work if able (ie. use of step stool to decrease amount of reach overhead when working). Use ice/heat for pain managment. HEP: Self myofascial release with ball, shoulder stretches.    Person(s) Educated Patient    Methods Explanation;Demonstration;Handout    Comprehension Verbalized understanding            OT Short Term Goals - 06/14/20 0919      OT SHORT TERM GOAL #1   Title Patient will be educated and independent with HEP in order to faciliate progress in therapy and allow him to complete required work activities with less pain and greater shoulder stability.    Time 3    Period Weeks    Status New    Target Date 07/05/20      OT SHORT TERM GOAL #2   Title Patient will decrease right shoulder/scapular fascial restrictions to min amount or less in order to decrease pain level and allow for better scapular alignment overall when complete reaching tasks.    Time 3    Period Weeks    Status New      OT SHORT TERM GOAL #3   Title Patient will report a decrease in pain of no more than 3/10 overall in right shoulder while utilizing pain management techniques and HEP provided during therapy sessions.    Time 3    Period Weeks    Status New      OT SHORT TERM GOAL #4   Title Patient will demonstrate improved postural control with decreased shoulder rounding and cervical alignment over shoulders while increasing his scapular strength and stability and allowing him to complete required work tasks.    Time 3    Period Weeks    Status New                    Plan - 06/14/20 0914    Clinical Impression Statement A: patient is a 44 y/o male S/P right shoulder pain causing decreased scapular stability and strength and increased fascial restrictions while resulting in pain while completing work requirements and  poor postural control.    OT Occupational Profile and History Detailed Assessment- Review of Records and additional review of physical, cognitive, psychosocial history related to current functional performance    Occupational performance deficits (Please refer to evaluation for details): ADL's;IADL's;Rest and Sleep;Work    Marketing executive / Function / Physical Skills ADL;UE functional use;Pain;Fascial restriction;Strength    Rehab Potential Excellent    Clinical Decision Making Limited treatment options, no task modification necessary    Comorbidities Affecting Occupational Performance: None    Modification or Assistance to Complete Evaluation  No modification of tasks or assist necessary to complete eval    OT Frequency 2x / week    OT Duration Other (comment)   3 weeks   OT Treatment/Interventions Self-care/ADL training;Ultrasound;Patient/family education;DME and/or AE instruction;Cryotherapy;Electrical Stimulation;Moist Heat;Neuromuscular education;Therapeutic activities;Manual Therapy;Therapeutic exercise    Plan P: Patient will benefit from skilled OT services to increase his overall scapular strength and shoulder stability in or to provide the needed support and stability to complete work tasks with decreased pain. Treatment Plan: Complete instead of FOTO complete UEFI. Provided scapular strengthening HEP if able to tolerate. Treatment plan: Myofascial release, scapular strengthening, middle trapezius strengthening, posture education during work tasks, task modification education. Modalities PRN.    OT Home Exercise Plan eval: hot pack for pain managment. shoulder stretches    Consulted and Agree with Plan of Care  Patient           Patient will benefit from skilled therapeutic intervention in order to improve the following deficits and impairments:   Body Structure / Function / Physical Skills: ADL,UE functional use,Pain,Fascial restriction,Strength       Visit Diagnosis: Acute pain of  right shoulder - Plan: Ot plan of care cert/re-cert  Other symptoms and signs involving the musculoskeletal system - Plan: Ot plan of care cert/re-cert    Problem List Patient Active Problem List   Diagnosis Date Noted  . HNP (herniated nucleus pulposus), cervical 01/18/2020  . Spinal stenosis of cervical region 08/08/2018  . 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 Enteritis 12/04/2016  . IBS (irritable bowel syndrome) 04/08/2016   Ailene Ravel, OTR/L,CBIS  [phone]  06/14/2020, 9:26 AM  [facility] [address] [place], [name], [address] Phone: [phone]   Fax:  [phone]  Name: Desmin Daleo MRN: [id number] Date of Birth: Aug 31, 1975

## 2020-06-25 ENCOUNTER — Encounter (HOSPITAL_COMMUNITY): Payer: Self-pay

## 2020-06-25 ENCOUNTER — Ambulatory Visit (HOSPITAL_COMMUNITY): Payer: PRIVATE HEALTH INSURANCE

## 2020-06-25 ENCOUNTER — Other Ambulatory Visit: Payer: Self-pay

## 2020-06-25 DIAGNOSIS — R29898 Other symptoms and signs involving the musculoskeletal system: Secondary | ICD-10-CM

## 2020-06-25 DIAGNOSIS — M25511 Pain in right shoulder: Secondary | ICD-10-CM

## 2020-06-25 NOTE — Patient Instructions (Addendum)
Complete the following standing with band anchored in the door. Complete once a day if possible.  1) (Home) Extension: Isometric / Bilateral Arm Retraction - Sitting   Facing anchor, hold hands and elbow at shoulder height, with elbow bent.  Pull arms back to squeeze shoulder blades together. Repeat 10-15 times.   2) (Clinic) Extension / Flexion (Assist)   Face anchor, pull arms back, keeping elbow straight, and squeze shoulder blades together. Repeat 10-15 times.    Copyright  VHI. All rights reserved.   3) (Home) Retraction: Row - Bilateral (Anchor)   Facing anchor, arms reaching forward, pull hands toward stomach, keeping elbows bent and at your sides and pinching shoulder blades together. Repeat 10-15 times.    Copyright  VHI. All rights reserved.

## 2020-06-26 NOTE — Therapy (Signed)
[place], [name], [address] Phone: [phone]   Fax:  [phone]  Occupational Therapy Treatment  Patient Details  Name: Paul Morales MRN: [id number] Date of Birth: 1975/07/17 Referring Provider (OT): Clemmie Krill, [place]   Encounter Date: 06/25/2020   OT End of Session - 06/25/20 1726    Visit Number 2    Number of Visits 6    Date for OT Re-Evaluation 07/05/20    Authorization Type AMBETTER - pt pays premium monthly for coverage.    Authorization Time Period the month of december (may change insurance in the new year. Will let us know if treatment is to extend into January). 30 visits limit.    Authorization - Number of Visits 2    Progress Note Due on Visit 30    OT Start Time 1655    OT Stop Time 1734    OT Time Calculation (min) 39 min    Activity Tolerance Patient tolerated treatment well    Behavior During Therapy WFL for tasks assessed/performed           Past Medical History:  Diagnosis Date  . Anemia   . Crohn's disease ([place])   . Heart murmur    "T9" per patient  . History of kidney stones    surgery to remove  . HSV infection   . IBS (irritable bowel syndrome) 04/08/2016    Past Surgical History:  Procedure Laterality Date  . ANTERIOR CERVICAL DECOMP/DISCECTOMY FUSION N/A 01/18/2020   Procedure: c3-4, c4-5 ANTERIOR CERVICAL DECOMPRESSION/DISCECTOMY FUSION, ALLOGRAFT, PLATE;  Surgeon: Marybelle Killings, MD;  Location: [facility];  Service: Orthopedics;  Laterality: N/A;  . COLONOSCOPY N/A 12/22/2016   Procedure: COLONOSCOPY;  Surgeon: Rogene Houston, MD;  [facility];  Service: Endoscopy;  Laterality: N/A;  12:00  . LITHOTRIPSY    . VASECTOMY    . WISDOM TOOTH EXTRACTION      There were no vitals filed for this visit.   Subjective Assessment - 06/25/20 1716    Subjective  S: That sore spot from last time doesn't feel as bad today.    Currently in Pain? No/denies               [facility] OT Assessment - 06/25/20 1716      Assessment   Medical Diagnosis right shoulder pain      Precautions   Precautions None                    OT Treatments/Exercises (OP) - 06/25/20 1717      Exercises   Exercises Shoulder      Shoulder Exercises: Supine   Horizontal ABduction PROM;5 reps    External Rotation PROM;5 reps    Internal Rotation PROM;5 reps    Flexion PROM;5 reps    ABduction PROM;5 reps      Shoulder Exercises: Standing   Extension Theraband;10 reps    Theraband Level (Shoulder Extension) Level 2 (Red)    Row Theraband;10 reps    Theraband Level (Shoulder Row) Level 2 (Red)    Retraction Theraband;10 reps    Theraband Level (Shoulder Retraction) Level 2 (Red)    Other Standing Exercises Y arm lift off; 10X followed by I lift off 10X A/ROM      Manual Therapy   Manual Therapy Myofascial release    Manual therapy comments Manual therapy completed prior to exercises    Myofascial Release Myofascial release and manual  stretching completed to right upper arm, trapezius, and scapularis region to decrease fascial restrictions and increase joint mobility in pain free zone.                  OT Education - 06/26/20 0840    Education Details scapular strengthening using red theraband. Encouraged him to continue with shoulder stretches as needed.    Person(s) Educated Patient    Methods Explanation;Handout    Comprehension Verbalized understanding            OT Short Term Goals - 06/26/20 0848      OT SHORT TERM GOAL #1   Title Patient will be educated and independent with HEP in order to faciliate progress in therapy and allow him to complete required work activities with less pain and greater shoulder stability.    Time 3    Period Weeks    Status On-going    Target Date 07/05/20      OT SHORT TERM GOAL #2   Title Patient will decrease right shoulder/scapular fascial restrictions to min amount or less in order to decrease  pain level and allow for better scapular alignment overall when complete reaching tasks.    Time 3    Period Weeks    Status On-going      OT SHORT TERM GOAL #3   Title Patient will report a decrease in pain of no more than 3/10 overall in right shoulder while utilizing pain management techniques and HEP provided during therapy sessions.    Time 3    Period Weeks    Status On-going      OT SHORT TERM GOAL #4   Title Patient will demonstrate improved postural control with decreased shoulder rounding and cervical alignment over shoulders while increasing his scapular strength and stability and allowing him to complete required work tasks.    Time 3    Period Weeks    Status On-going                    Plan - 06/26/20 6195    Clinical Impression Statement A: Initiated myofascial release, manual stretching, and scapular strengthening. Provided red band with scapular strengthening exercises for HEP. Provided VC for form and technique with patient able to return demonstration. Manual techniques completed to address fascial restrictions in the anterior shoulder region and upper trapezius with good response in the upper trapezius. Patinet reports that his work load has slightly lessened  although he continues to experience pain from physical labor.    Body Structure / Function / Physical Skills ADL;UE functional use;Pain;Fascial restriction;Strength    Plan P: Follow up on HEP. Add proximal shoulder strengthening, therapy ball strengthening, UBE bike. Progress to green band if patient reports no increased pain with red.    OT Home Exercise Plan eval: hot pack for pain managment. shoulder stretches 12/22: Red band scapular strengthening.    Consulted and Agree with Plan of Care Patient           Patient will benefit from skilled therapeutic intervention in order to improve the following deficits and impairments:   Body Structure / Function / Physical Skills: ADL,UE functional  use,Pain,Fascial restriction,Strength       Visit Diagnosis: Acute pain of right shoulder  Other symptoms and signs involving the musculoskeletal system    Problem List Patient Active Problem List   Diagnosis Date Noted  . HNP (herniated nucleus pulposus), cervical 01/18/2020  . Spinal stenosis of cervical region 08/08/2018  .  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 Enteritis 12/04/2016  . IBS (irritable bowel syndrome) 04/08/2016   Ailene Ravel, OTR/L,CBIS  [phone]  06/26/2020, 8:50 AM  [facility] [address] [place], [name], [address] Phone: [phone]   Fax:  [phone]  Name: Paul Morales MRN: [id number] Date of Birth: 03/05/1976

## 2020-06-27 ENCOUNTER — Encounter (HOSPITAL_COMMUNITY): Payer: Self-pay

## 2020-06-27 ENCOUNTER — Other Ambulatory Visit: Payer: Self-pay

## 2020-06-27 ENCOUNTER — Ambulatory Visit (HOSPITAL_COMMUNITY): Payer: PRIVATE HEALTH INSURANCE

## 2020-06-27 DIAGNOSIS — M25511 Pain in right shoulder: Secondary | ICD-10-CM

## 2020-06-27 DIAGNOSIS — R29898 Other symptoms and signs involving the musculoskeletal system: Secondary | ICD-10-CM

## 2020-06-27 NOTE — Therapy (Signed)
[facility] [place], [name], [address] Phone: [phone]   Fax:  [phone]  Occupational Therapy Treatment  Patient Details  Name: Paul Morales MRN: [id number] Date of Birth: 31-Dec-1975 Referring Provider (OT): Clemmie Krill, [place]   Encounter Date: 06/27/2020   OT End of Session - 06/27/20 1239    Visit Number 3    Number of Visits 6    Date for OT Re-Evaluation 07/05/20    Authorization Type AMBETTER - pt pays premium monthly for coverage- Has paid for December for coverage.    Authorization Time Period Approved 6 visits (06/25/20-08/07/20) Request more visits as needed. May request additional visits before provided timeframe. Has a 30 visit limit OT/PT/SLP combined per calendar year. 0 used at eval. (May change insurance in the new year. Will let us know if treatment is to extend into January).    Authorization - Visit Number 2    Authorization - Number of Visits 6    OT Start Time [phone]    OT Stop Time 0855    OT Time Calculation (min) 38 min    Activity Tolerance Patient tolerated treatment well    Behavior During Therapy [facility] for tasks assessed/performed           Past Medical History:  Diagnosis Date  . Anemia   . Crohn's disease ([place])   . Heart murmur    "T9" per patient  . History of kidney stones    surgery to remove  . HSV infection   . IBS (irritable bowel syndrome) 04/08/2016    Past Surgical History:  Procedure Laterality Date  . ANTERIOR CERVICAL DECOMP/DISCECTOMY FUSION N/A 01/18/2020   Procedure: c3-4, c4-5 ANTERIOR CERVICAL DECOMPRESSION/DISCECTOMY FUSION, ALLOGRAFT, PLATE;  Surgeon: Marybelle Killings, MD;  Location: [facility];  Service: Orthopedics;  Laterality: N/A;  . COLONOSCOPY N/A 12/22/2016   Procedure: COLONOSCOPY;  Surgeon: Rogene Houston, MD;  [facility];  Service: Endoscopy;  Laterality: N/A;  12:00  . LITHOTRIPSY    . VASECTOMY    . WISDOM TOOTH EXTRACTION      There  were no vitals filed for this visit.   Subjective Assessment - 06/27/20 0834    Subjective  S: It's really stiff.    Currently in Pain? Yes    Pain Score 3    Unable to tolerate light touch during manual techniques.   Pain Location Shoulder    Pain Orientation Right    Pain Descriptors / Indicators Sore;Constant;Throbbing    Pain Type Acute pain    Pain Radiating Towards entire shoulder    Pain Onset Yesterday    Pain Frequency Constant    Aggravating Factors  when having to reach above shoulder height at work, heavy lifting and any forward shoulder work.    Pain Relieving Factors heat    Effect of Pain on Daily Activities when it's a 10/10 it'll prevent him from doing what he needs to do.    Multiple Pain Sites No              [facility] OT Assessment - 06/27/20 0836      Assessment   Medical Diagnosis right shoulder pain      Precautions   Precautions None                    OT Treatments/Exercises (OP) - 06/27/20 0837      Exercises   Exercises Shoulder  Shoulder Exercises: ROM/Strengthening   UBE (Upper Arm Bike) Level 1 2' reverse 2' forward   pace: 5.0-6.0   Proximal Shoulder Strengthening, Seated 10X A/ROM      Modalities   Modalities Moist Heat;Electrical Stimulation      Moist Heat Therapy   Number Minutes Moist Heat 10 Minutes    Moist Heat Location Shoulder      Electrical Stimulation   Electrical Stimulation Location right shoulder    Electrical Stimulation Action interferential    Electrical Stimulation Parameters 13.0 CV    Electrical Stimulation Goals Pain      Manual Therapy   Manual Therapy Myofascial release    Manual therapy comments Manual therapy completed prior to exercises    Myofascial Release Attempted myofascial release although unable to tolerate light touch.                    OT Short Term Goals - 06/26/20 0848      OT SHORT TERM GOAL #1   Title Patient will be educated and independent with HEP in order  to faciliate progress in therapy and allow him to complete required work activities with less pain and greater shoulder stability.    Time 3    Period Weeks    Status On-going    Target Date 07/05/20      OT SHORT TERM GOAL #2   Title Patient will decrease right shoulder/scapular fascial restrictions to min amount or less in order to decrease pain level and allow for better scapular alignment overall when complete reaching tasks.    Time 3    Period Weeks    Status On-going      OT SHORT TERM GOAL #3   Title Patient will report a decrease in pain of no more than 3/10 overall in right shoulder while utilizing pain management techniques and HEP provided during therapy sessions.    Time 3    Period Weeks    Status On-going      OT SHORT TERM GOAL #4   Title Patient will demonstrate improved postural control with decreased shoulder rounding and cervical alignment over shoulders while increasing his scapular strength and stability and allowing him to complete required work tasks.    Time 3    Period Weeks    Status On-going                    Plan - 06/27/20 1242    Clinical Impression Statement A: Pt reports that he was fired from his job yesterday. He apparently had reached his max of 15 points for attendance although he never had a discussion with management regarding them. Pt states that mornings he feels more stiff and painful. Unable tolerate even light touch for myfascial release. ES and moist heat was used to help decrease muscle pain and spasms. Pt reports feeling good after use. Completed proximal shoulder strengthening standing and UBE bike for scapular endurance and strength. VC for form and technique were provided during session.    Body Structure / Function / Physical Skills ADL;UE functional use;Pain;Fascial restriction;Strength    Plan P: re-attempt myofascial release if able to tolerate. Therapy ball strengthening.    Consulted and Agree with Plan of Care Patient            Patient will benefit from skilled therapeutic intervention in order to improve the following deficits and impairments:   Body Structure / Function / Physical Skills: ADL,UE functional use,Pain,Fascial restriction,Strength  Visit Diagnosis: Acute pain of right shoulder  Other symptoms and signs involving the musculoskeletal system    Problem List Patient Active Problem List   Diagnosis Date Noted  . HNP (herniated nucleus pulposus), cervical 01/18/2020  . Spinal stenosis of cervical region 08/08/2018  . 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 Enteritis 12/04/2016  . IBS (irritable bowel syndrome) 04/08/2016   Ailene Ravel, OTR/L,CBIS  [phone]  06/27/2020, 12:47 PM  [facility] [address] [place], [name], [address] Phone: [phone]   Fax:  [phone]  Name: Ovadia Lopp MRN: [id number] Date of Birth: 07/08/1975

## 2020-07-02 ENCOUNTER — Ambulatory Visit (HOSPITAL_COMMUNITY): Payer: PRIVATE HEALTH INSURANCE | Admitting: Occupational Therapy

## 2020-07-02 ENCOUNTER — Encounter (HOSPITAL_COMMUNITY): Payer: Self-pay | Admitting: Occupational Therapy

## 2020-07-02 ENCOUNTER — Other Ambulatory Visit: Payer: Self-pay

## 2020-07-02 DIAGNOSIS — M25511 Pain in right shoulder: Secondary | ICD-10-CM | POA: Diagnosis not present

## 2020-07-02 DIAGNOSIS — R29898 Other symptoms and signs involving the musculoskeletal system: Secondary | ICD-10-CM

## 2020-07-02 NOTE — Therapy (Signed)
[facility] [place], [name], [address] Phone: [phone]   Fax:  [phone]  Occupational Therapy Treatment  Patient Details  Name: Paul Morales MRN: [id number] Date of Birth: 1975/07/30 Referring Provider (OT): Clemmie Krill, [place]   Encounter Date: 07/02/2020   OT End of Session - 07/02/20 1012    Visit Number 4    Number of Visits 6    Date for OT Re-Evaluation 07/05/20    Authorization Type AMBETTER - pt pays premium monthly for coverage- Has paid for December for coverage.    Authorization Time Period Approved 6 visits (06/25/20-08/07/20) Request more visits as needed. May request additional visits before provided timeframe. Has a 30 visit limit OT/PT/SLP combined per calendar year. 0 used at eval. (May change insurance in the new year. Will let us know if treatment is to extend into January).    Authorization - Visit Number 3    Authorization - Number of Visits 6    OT Start Time [phone]    OT Stop Time 1018    OT Time Calculation (min) 40 min    Activity Tolerance Patient tolerated treatment well    Behavior During Therapy WFL for tasks assessed/performed           Past Medical History:  Diagnosis Date  . Anemia   . Crohn's disease ([place])   . Heart murmur    "T9" per patient  . History of kidney stones    surgery to remove  . HSV infection   . IBS (irritable bowel syndrome) 04/08/2016    Past Surgical History:  Procedure Laterality Date  . ANTERIOR CERVICAL DECOMP/DISCECTOMY FUSION N/A 01/18/2020   Procedure: c3-4, c4-5 ANTERIOR CERVICAL DECOMPRESSION/DISCECTOMY FUSION, ALLOGRAFT, PLATE;  Surgeon: Marybelle Killings, MD;  Location: [facility];  Service: Orthopedics;  Laterality: N/A;  . COLONOSCOPY N/A 12/22/2016   Procedure: COLONOSCOPY;  Surgeon: Rogene Houston, MD;  [facility];  Service: Endoscopy;  Laterality: N/A;  12:00  . LITHOTRIPSY    . VASECTOMY    . WISDOM TOOTH EXTRACTION      There  were no vitals filed for this visit.   Subjective Assessment - 07/02/20 0940    Subjective  S: It hasn't been bothering me as much since I'm not working.    Currently in Pain? No/denies              [facility] OT Assessment - 07/02/20 0940      Assessment   Medical Diagnosis right shoulder pain      Precautions   Precautions None                    OT Treatments/Exercises (OP) - 07/02/20 0941      Exercises   Exercises Shoulder      Shoulder Exercises: Standing   Protraction AROM;12 reps    Horizontal ABduction AROM;12 reps    External Rotation AROM;12 reps    Internal Rotation AROM;12 reps    Flexion AROM;12 reps    ABduction AROM;12 reps    Extension Theraband;10 reps    Theraband Level (Shoulder Extension) Level 2 (Red)    Row Theraband;10 reps    Theraband Level (Shoulder Row) Level 2 (Red)    Retraction Theraband;10 reps    Theraband Level (Shoulder Retraction) Level 2 (Red)    Other Standing Exercises Y arm lift off; 10X followed by I lift off 10X A/ROM      Shoulder Exercises:  Therapy Ball   Other Therapy Ball Exercises green therapy ball: chest press, flexion, circles each direction, overhead press, 10X each      Shoulder Exercises: ROM/Strengthening   UBE (Upper Arm Bike) Level 2 3' forward 3' reverse; pace 8.5    X to V Arms 12X    Proximal Shoulder Strengthening, Seated 10X each, no rest breaks    Ball on Wall 1' flexion 1' abduction      Manual Therapy   Manual Therapy Myofascial release    Manual therapy comments Manual therapy completed prior to exercises    Myofascial Release Myofascial release and manual stretching completed to right upper arm, trapezius, and scapularis region to decrease fascial restrictions and increase joint mobility in pain free zone.                    OT Short Term Goals - 06/26/20 0848      OT SHORT TERM GOAL #1   Title Patient will be educated and independent with HEP in order to faciliate progress in  therapy and allow him to complete required work activities with less pain and greater shoulder stability.    Time 3    Period Weeks    Status On-going    Target Date 07/05/20      OT SHORT TERM GOAL #2   Title Patient will decrease right shoulder/scapular fascial restrictions to min amount or less in order to decrease pain level and allow for better scapular alignment overall when complete reaching tasks.    Time 3    Period Weeks    Status On-going      OT SHORT TERM GOAL #3   Title Patient will report a decrease in pain of no more than 3/10 overall in right shoulder while utilizing pain management techniques and HEP provided during therapy sessions.    Time 3    Period Weeks    Status On-going      OT SHORT TERM GOAL #4   Title Patient will demonstrate improved postural control with decreased shoulder rounding and cervical alignment over shoulders while increasing his scapular strength and stability and allowing him to complete required work tasks.    Time 3    Period Weeks    Status On-going                    Plan - 07/02/20 0955    Clinical Impression Statement A: Pt reports improvement in pain level since he has not been lifting heavy items over the past week and weekend. Resumed myofascial release working along upper arm and trapezius regions to address fascial restrictions. Pt completing A/ROM in standing, working on scapular retraction at end ROM. Added x to v arms, therapy ball strengthening, and ball on wall today. Pt reporting mild soreness and fatigue but no increased pain with tasks. Verbal cuing for form and technique.    Body Structure / Function / Physical Skills ADL;UE functional use;Pain;Fascial restriction;Strength    Plan P: continue with myofascial release, attempt looped theraband strengthening    OT Home Exercise Plan eval: hot pack for pain managment. shoulder stretches 12/22: Red band scapular strengthening.    Consulted and Agree with Plan of Care  Patient           Patient will benefit from skilled therapeutic intervention in order to improve the following deficits and impairments:   Body Structure / Function / Physical Skills: ADL,UE functional use,Pain,Fascial restriction,Strength  Visit Diagnosis: Acute pain of right shoulder  Other symptoms and signs involving the musculoskeletal system    Problem List Patient Active Problem List   Diagnosis Date Noted  . HNP (herniated nucleus pulposus), cervical 01/18/2020  . Spinal stenosis of cervical region 08/08/2018  . 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 Enteritis 12/04/2016  . IBS (irritable bowel syndrome) 04/08/2016   Guadelupe Sabin, OTR/L  [phone] 07/02/2020, 10:19 AM  [facility] [address] [place], [name], [address] Phone: [phone]   Fax:  [phone]  Name: Paul Morales MRN: [id number] Date of Birth: July 19, 1975

## 2020-07-05 ENCOUNTER — Other Ambulatory Visit: Payer: Self-pay

## 2020-07-05 ENCOUNTER — Ambulatory Visit (HOSPITAL_COMMUNITY): Payer: PRIVATE HEALTH INSURANCE | Admitting: Specialist

## 2020-07-05 DIAGNOSIS — M25511 Pain in right shoulder: Secondary | ICD-10-CM | POA: Diagnosis not present

## 2020-07-05 DIAGNOSIS — R29898 Other symptoms and signs involving the musculoskeletal system: Secondary | ICD-10-CM

## 2020-07-06 ENCOUNTER — Encounter (HOSPITAL_COMMUNITY): Payer: Self-pay | Admitting: Specialist

## 2020-07-06 NOTE — Therapy (Addendum)
[facility] [place], [name], [address] Phone: [phone]   Fax:  [phone]  Occupational Therapy Treatment  Patient Details  Name: Paul Morales MRN: [id number] Date of Birth: 1975-10-04 Referring Provider (OT): Clemmie Krill, [place]   Encounter Date: 07/05/2020   OT End of Session - 07/06/20 0834    Visit Number 5    Number of Visits 6    Date for OT Re-Evaluation 07/05/20    Authorization Type AMBETTER - pt pays premium monthly for coverage- Has paid for December for coverage.    Authorization Time Period Approved 6 visits (06/25/20-08/07/20) Request more visits as needed. May request additional visits before provided timeframe. Has a 30 visit limit OT/PT/SLP combined per calendar year. 0 used at eval. (May change insurance in the new year. Will let us know if treatment is to extend into January).    Authorization - Visit Number 4    Authorization - Number of Visits 6    Progress Note Due on Visit 75    OT Start Time 1025    OT Stop Time 1645    OT Time Calculation (min) 40 min    Activity Tolerance Patient tolerated treatment well    Behavior During Therapy WFL for tasks assessed/performed           Past Medical History:  Diagnosis Date  . Anemia   . Crohn's disease ([place])   . Heart murmur    "T9" per patient  . History of kidney stones    surgery to remove  . HSV infection   . IBS (irritable bowel syndrome) 04/08/2016    Past Surgical History:  Procedure Laterality Date  . ANTERIOR CERVICAL DECOMP/DISCECTOMY FUSION N/A 01/18/2020   Procedure: c3-4, c4-5 ANTERIOR CERVICAL DECOMPRESSION/DISCECTOMY FUSION, ALLOGRAFT, PLATE;  Surgeon: Marybelle Killings, MD;  Location: [facility];  Service: Orthopedics;  Laterality: N/A;  . COLONOSCOPY N/A 12/22/2016   Procedure: COLONOSCOPY;  Surgeon: Rogene Houston, MD;  [facility];  Service: Endoscopy;  Laterality: N/A;  12:00  . LITHOTRIPSY    . VASECTOMY    .  WISDOM TOOTH EXTRACTION      There were no vitals filed for this visit.   Subjective Assessment - 07/06/20 0833    Subjective  S:  It still is really sore where the knots are in the back of my shoulder and the front of my shoulder    Currently in Pain? No/denies    Pain Score 3     Pain Descriptors / Indicators Sore    Pain Type Acute pain    Pain Onset More than a month ago              [facility] OT Assessment - 07/06/20 0001      Assessment   Medical Diagnosis right shoulder pain      Precautions   Precautions None                    OT Treatments/Exercises (OP) - 07/06/20 0001      Exercises   Exercises Shoulder      Shoulder Exercises: Supine   Protraction PROM;5 reps;Strengthening;10 reps    Protraction Weight (lbs) 1    Horizontal ABduction PROM;5 reps;Strengthening;10 reps    Horizontal ABduction Weight (lbs) 1    External Rotation PROM;5 reps;Strengthening;10 reps    External Rotation Weight (lbs) 1    Internal Rotation PROM;5 reps;Strengthening;10 reps    Internal Rotation Weight (  lbs) 1    Flexion PROM;5 reps;Strengthening;10 reps    Shoulder Flexion Weight (lbs) 1    ABduction PROM;5 reps;Strengthening;10 reps    Shoulder ABduction Weight (lbs) 1      Shoulder Exercises: ROM/Strengthening   Sustained Retraction with Theraband with red theraband, sustained retraction with shoulder flexion 10 times, with shoulder flexed to 90 completed retraction with red theraband loop 10 times    Other ROM/Strengthening Exercises theraband loop red walked hands up and down wall with sustained retraction 5 times    Other ROM/Strengthening Exercises holding therapy ball completed chest press, overhead press, flexion, overhead v 15 times      Manual Therapy   Manual Therapy Myofascial release    Manual therapy comments Manual therapy completed prior to exercises    Myofascial Release Myofascial release and manual stretching completed to right upper arm, trapezius,  and scapularis region to decrease fascial restrictions and increase joint mobility in pain free zone.                    OT Short Term Goals - 06/26/20 0848      OT SHORT TERM GOAL #1   Title Patient will be educated and independent with HEP in order to faciliate progress in therapy and allow him to complete required work activities with less pain and greater shoulder stability.    Time 3    Period Weeks    Status On-going    Target Date 07/05/20      OT SHORT TERM GOAL #2   Title Patient will decrease right shoulder/scapular fascial restrictions to min amount or less in order to decrease pain level and allow for better scapular alignment overall when complete reaching tasks.    Time 3    Period Weeks    Status On-going      OT SHORT TERM GOAL #3   Title Patient will report a decrease in pain of no more than 3/10 overall in right shoulder while utilizing pain management techniques and HEP provided during therapy sessions.    Time 3    Period Weeks    Status On-going      OT SHORT TERM GOAL #4   Title Patient will demonstrate improved postural control with decreased shoulder rounding and cervical alignment over shoulders while increasing his scapular strength and stability and allowing him to complete required work tasks.    Time 3    Period Weeks    Status On-going               A:  Patient continues to experience significant pain in right shoulder, however, is able to complete [facility] A/ROM and tolerate WFL P/ROM.  Initiated strengthening with 1# in supine this date.  In standing, patient with palpable clicking and popping in left scapular region with sustained retraction combined with flexion, which is not present in his right scapular region.      Plan - 07/06/20 0839    Plan P:  continue strengthening in supine and add standing.  increase reps with looped theraband exercises.  focus on scapular retraction exercises and proximal shoulder stability exercises to  decrease hyper mobility of right scapula.  Recert, as patient has 2 visits left in insurance auth.           Patient will benefit from skilled therapeutic intervention in order to improve the following deficits and impairments:   Body Structure / Function / Physical Skills: ADL,UE functional use,Pain,Fascial restriction,Strength  Visit Diagnosis: Acute pain of right shoulder  Other symptoms and signs involving the musculoskeletal system    Problem List Patient Active Problem List   Diagnosis Date Noted  . HNP (herniated nucleus pulposus), cervical 01/18/2020  . Spinal stenosis of cervical region 08/08/2018  . 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 Enteritis 12/04/2016  . IBS (irritable bowel syndrome) 04/08/2016    Vangie Bicker, [place], OTR/L [phone]  07/06/2020, 8:45 AM  [facility] [place], [name], [address] Phone: [phone]   Fax:  [phone]  Name: Paul Morales MRN: [id number] Date of Birth: June 14, 1976

## 2020-07-12 ENCOUNTER — Encounter (HOSPITAL_COMMUNITY): Payer: Self-pay

## 2020-07-12 ENCOUNTER — Other Ambulatory Visit: Payer: Self-pay

## 2020-07-12 ENCOUNTER — Ambulatory Visit (HOSPITAL_COMMUNITY): Payer: 59 | Attending: Physician Assistant

## 2020-07-12 DIAGNOSIS — R29898 Other symptoms and signs involving the musculoskeletal system: Secondary | ICD-10-CM | POA: Insufficient documentation

## 2020-07-12 DIAGNOSIS — M25511 Pain in right shoulder: Secondary | ICD-10-CM | POA: Diagnosis not present

## 2020-07-12 NOTE — Therapy (Signed)
[place], [name], [address] Phone: [phone]   Fax:  [phone]  Occupational Therapy Treatment Reassessment/re-cert Patient Details  Name: Paul Morales MRN: [id number] Date of Birth: Sep 05, 1975 Referring Provider (OT): Clemmie Krill, [place]   Encounter Date: 07/12/2020   OT End of Session - 07/12/20 0915    Visit Number 6    Number of Visits 10    Date for OT Re-Evaluation 08/09/20    Authorization Type *New insurance* [employer] $20 copay    Authorization Time Period (07/07/20-07/06/21)    Progress Note Due on Visit --    OT Start Time 0817    OT Stop Time 0856    OT Time Calculation (min) 39 min    Activity Tolerance Patient tolerated treatment well    Behavior During Therapy [facility] for tasks assessed/performed           Past Medical History:  Diagnosis Date  . Anemia   . Crohn's disease ([place])   . Heart murmur    "T9" per patient  . History of kidney stones    surgery to remove  . HSV infection   . IBS (irritable bowel syndrome) 04/08/2016    Past Surgical History:  Procedure Laterality Date  . ANTERIOR CERVICAL DECOMP/DISCECTOMY FUSION N/A 01/18/2020   Procedure: c3-4, c4-5 ANTERIOR CERVICAL DECOMPRESSION/DISCECTOMY FUSION, ALLOGRAFT, PLATE;  Surgeon: Marybelle Killings, MD;  Location: [place];  Service: Orthopedics;  Laterality: N/A;  . COLONOSCOPY N/A 12/22/2016   Procedure: COLONOSCOPY;  Surgeon: Rogene Houston, MD;  [facility];  Service: Endoscopy;  Laterality: N/A;  12:00  . LITHOTRIPSY    . VASECTOMY    . WISDOM TOOTH EXTRACTION      There were no vitals filed for this visit.   Subjective Assessment - 07/12/20 0824    Subjective  S: It feels good today. Probably because I didn't do anything with it yesterday.    Special Tests UEFI: 78/80 (98% independent)    Currently in Pain? No/denies              [facility] OT Assessment - 07/12/20 0824      Assessment    Medical Diagnosis right shoulder pain    Referring Provider (OT) Clemmie Krill, PA-C      Precautions   Precautions None      Prior Function   Level of Independence Independent    Vocation Unemployed      Observation/Other Assessments   Outcome Measures UEFI: 78/80 (98% independence)      Posture/Postural Control   Posture/Postural Control Postural limitations    Postural Limitations Rounded Shoulders;Forward head      Palpation   Palpation comment Min fascial restrictions noted in right upper arm, trapezius, and scapularis region.      AROM   Overall AROM Comments Full A/ROM bilaterally in shoulder in all ranges while seated/standing.      Strength   Overall Strength Comments patient displays 5/5 Bilateral shoulder strength in all ranges.                    OT Treatments/Exercises (OP) - 07/12/20 0911      Exercises   Exercises Shoulder      Manual Therapy   Manual Therapy Myofascial release;Other (comment)    Manual therapy comments Manual therapy completed prior to exercises    Myofascial Release Myofascial release completed to right upper arm, trapezius, and scapularis region to decrease  fascial restrictions and increase joint mobility in pain free zone.    Other Manual Therapy Massage gun used on level 1 to right upper trapezius, anterior and posterior deltoid to decrease muscle tightness and decrease fascial restrictions.                  OT Education - 07/12/20 0913    Education Details Reviewed therapy goals and progress in therapy. Discussed remaining deficits. Education provided on continuing with HEP and use of massage gun at home to help with muscle tightness and pain. Education provided on use of massage gun, settings recommendation, placement and areas to avoid, and how long to use on each area.    Person(s) Educated Patient    Methods Explanation;Handout;Demonstration    Comprehension Verbalized understanding            OT Short  Term Goals - 07/12/20 0846      OT SHORT TERM GOAL #1   Title Patient will be educated and independent with HEP in order to faciliate progress in therapy and allow him to complete required work activities with less pain and greater shoulder stability.    Time 3    Period Weeks    Status Achieved    Target Date 07/05/20      OT SHORT TERM GOAL #2   Title Patient will decrease right shoulder/scapular fascial restrictions to min amount or less in order to decrease pain level and allow for better scapular alignment overall when complete reaching tasks.    Time 3    Period Weeks    Status Achieved      OT SHORT TERM GOAL #3   Title Patient will report a decrease in pain of no more than 3/10 overall in right shoulder while utilizing pain management techniques and HEP provided during therapy sessions.    Time 3    Period Weeks    Status On-going      OT SHORT TERM GOAL #4   Title Patient will demonstrate improved postural control with decreased shoulder rounding and cervical alignment over shoulders while increasing his scapular strength and stability and allowing him to complete required work tasks.    Time 3    Period Weeks    Status On-going                    Plan - 07/12/20 0920    Clinical Impression Statement A: Reassessment completed this date. patient does voice improvement since he has been let go from his previous job. He is not currently working and is able to rest his RUE and not overuse it when completing daily tasks. His pain is a 0 today although he reports pain yesterday with no known cause. He is showing improvement with his pain while it has changef from constant to intermittent. He is completing his HEP at home and is using a red band for scapular strengthening. He has met 2/4 therapy goals at this time. His fascial restrictions have decreased from maximum amount to minimal amount and are locating in the upper trapezius and anterior and posterior deltoid. He  continues to demonstrate decreased postural control with rounded shoulders and a forward head although it is not as severe as the initial evaluation day. He is currently using a hot pack and self massage for pain management techniques. Introduced benefits of using massage gun during session to also focus on muscle tightness with patient voicing understanding.    Body Structure / Function / Physical  Skills ADL;UE functional use;Pain;Fascial restriction;Strength    OT Frequency 1x / week    OT Duration 4 weeks    Plan P: Continue OT services decreasing to 1X a week for 4 weeks to establish independence with pain management techniques to when pain fluctuates. Focus on scapular strengthening to improve postural control and scapular stability. Provide green band for HEP at home when shipment arrives.    Consulted and Agree with Plan of Care Patient           Patient will benefit from skilled therapeutic intervention in order to improve the following deficits and impairments:   Body Structure / Function / Physical Skills: ADL,UE functional use,Pain,Fascial restriction,Strength       Visit Diagnosis: Acute pain of right shoulder - Plan: Ot plan of care cert/re-cert  Other symptoms and signs involving the musculoskeletal system - Plan: Ot plan of care cert/re-cert    Problem List Patient Active Problem List   Diagnosis Date Noted  . HNP (herniated nucleus pulposus), cervical 01/18/2020  . Spinal stenosis of cervical region 08/08/2018  . 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 Enteritis 12/04/2016  . IBS (irritable bowel syndrome) 04/08/2016   Ailene Ravel, OTR/L,CBIS  [phone]  07/12/2020, 9:29 AM  [place] [address] [place], [name], [address] Phone: [phone]   Fax:  [phone]  Name: Paul Morales MRN: [id number] Date of Birth: February 01, 1976

## 2020-07-16 ENCOUNTER — Institutional Professional Consult (permissible substitution): Payer: Self-pay | Admitting: Internal Medicine

## 2020-07-16 ENCOUNTER — Other Ambulatory Visit: Payer: Self-pay

## 2020-07-19 ENCOUNTER — Ambulatory Visit (HOSPITAL_COMMUNITY): Payer: 59

## 2020-07-19 ENCOUNTER — Telehealth (HOSPITAL_COMMUNITY): Payer: Self-pay

## 2020-07-19 NOTE — Telephone Encounter (Signed)
pt called to cx this appt due to car trouble.

## 2020-07-20 ENCOUNTER — Ambulatory Visit (HOSPITAL_COMMUNITY): Payer: 59

## 2020-07-20 ENCOUNTER — Other Ambulatory Visit: Payer: Self-pay

## 2020-07-20 ENCOUNTER — Encounter (HOSPITAL_COMMUNITY): Payer: Self-pay

## 2020-07-20 DIAGNOSIS — M25511 Pain in right shoulder: Secondary | ICD-10-CM | POA: Diagnosis not present

## 2020-07-20 DIAGNOSIS — R29898 Other symptoms and signs involving the musculoskeletal system: Secondary | ICD-10-CM

## 2020-07-20 NOTE — Therapy (Signed)
[name], [name], [address] Phone: [phone]   Fax:  [phone]  Occupational Therapy Treatment  Patient Details  Name: Paul Morales MRN: [id number] Date of Birth: 15-Jul-1975 Referring Provider (OT): Clemmie Krill, [place]   Encounter Date: 07/20/2020   OT End of Session - 07/20/20 0854    Visit Number 7    Number of Visits 10    Date for OT Re-Evaluation 08/09/20    Authorization Type *New insurance* [employer] $20 copay    Authorization Time Period (07/07/20-07/06/21)    OT Start Time 0815    OT Stop Time 0855    OT Time Calculation (min) 40 min    Activity Tolerance Patient tolerated treatment well    Behavior During Therapy [facility] for tasks assessed/performed           Past Medical History:  Diagnosis Date  . Anemia   . Crohn's disease ([place])   . Heart murmur    "T9" per patient  . History of kidney stones    surgery to remove  . HSV infection   . IBS (irritable bowel syndrome) 04/08/2016    Past Surgical History:  Procedure Laterality Date  . ANTERIOR CERVICAL DECOMP/DISCECTOMY FUSION N/A 01/18/2020   Procedure: c3-4, c4-5 ANTERIOR CERVICAL DECOMPRESSION/DISCECTOMY FUSION, ALLOGRAFT, PLATE;  Surgeon: Marybelle Killings, MD;  Location: [place];  Service: Orthopedics;  Laterality: N/A;  . COLONOSCOPY N/A 12/22/2016   Procedure: COLONOSCOPY;  Surgeon: Rogene Houston, MD;  [facility];  Service: Endoscopy;  Laterality: N/A;  12:00  . LITHOTRIPSY    . VASECTOMY    . WISDOM TOOTH EXTRACTION      There were no vitals filed for this visit.   Subjective Assessment - 07/20/20 0832    Subjective  S: I'm sore today.    Currently in Pain? Yes    Pain Score 3     Pain Location Shoulder    Pain Orientation Right    Pain Descriptors / Indicators Sore    Pain Type Acute pain    Pain Radiating Towards shoulder joint    Pain Onset In the past 7 days    Pain Frequency Constant     Aggravating Factors  unsure. Possibly getting groceries    Pain Relieving Factors heat    Effect of Pain on Daily Activities Not right now                        OT Treatments/Exercises (OP) - 07/20/20 0835      Exercises   Exercises Shoulder      Shoulder Exercises: Supine   Horizontal ABduction PROM;5 reps    External Rotation PROM;5 reps   abducted   Internal Rotation PROM;5 reps   abducted   Flexion PROM;5 reps    ABduction PROM;5 reps      Shoulder Exercises: Standing   Other Standing Exercises Y arm lift off; 10X followed by I lift off 10X A/ROM      Shoulder Exercises: Therapy Ball   Other Therapy Ball Exercises green therapy ball: chest press, flexion, circles each direction, overhead press, 12X each      Shoulder Exercises: ROM/Strengthening   UBE (Upper Arm Bike) Level 3 4' reverse   pace: 10.5   X to V Arms 10X    Ball on Wall 1' flexion 1' abduction red weighted ball    Other ROM/Strengthening Exercises red band loop: clock  wall slide; 10X each (2, 3, and 4 o'clock). Band circles 10X                  OT Education - 07/20/20 0854    Education Details Provided green theraband for progression of scapular strengthening at home. Patient currently is using red theraband.    Person(s) Educated Patient    Methods Explanation    Comprehension Verbalized understanding            OT Short Term Goals - 07/20/20 1213      OT SHORT TERM GOAL #1   Title Patient will be educated and independent with HEP in order to faciliate progress in therapy and allow him to complete required work activities with less pain and greater shoulder stability.    Time 3    Period Weeks    Target Date 07/05/20      OT SHORT TERM GOAL #2   Title Patient will decrease right shoulder/scapular fascial restrictions to min amount or less in order to decrease pain level and allow for better scapular alignment overall when complete reaching tasks.    Time 3    Period Weeks       OT SHORT TERM GOAL #3   Title Patient will report a decrease in pain of no more than 3/10 overall in right shoulder while utilizing pain management techniques and HEP provided during therapy sessions.    Time 3    Period Weeks    Status On-going      OT SHORT TERM GOAL #4   Title Patient will demonstrate improved postural control with decreased shoulder rounding and cervical alignment over shoulders while increasing his scapular strength and stability and allowing him to complete required work tasks.    Time 3    Period Weeks    Status On-going                    Plan - 07/20/20 8921    Clinical Impression Statement A: Completed manual techniques and stretching for pain management prior to exercises. patient presented with a catching sensation in the anterior shoulder area when returning arm to table following shoulder flexion stretching. Focused on scapular strengthening and shoulder stability exercises. VC for form and technique.    Body Structure / Function / Physical Skills ADL;UE functional use;Pain;Fascial restriction;Strength    Plan P: Progress to green band for scapular strengthening.    Consulted and Agree with Plan of Care Patient           Patient will benefit from skilled therapeutic intervention in order to improve the following deficits and impairments:   Body Structure / Function / Physical Skills: ADL,UE functional use,Pain,Fascial restriction,Strength       Visit Diagnosis: Acute pain of right shoulder  Other symptoms and signs involving the musculoskeletal system    Problem List Patient Active Problem List   Diagnosis Date Noted  . HNP (herniated nucleus pulposus), cervical 01/18/2020  . Spinal stenosis of cervical region 08/08/2018  . 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 Enteritis 12/04/2016  . IBS (irritable bowel syndrome) 04/08/2016   Ailene Ravel, OTR/L,CBIS  [phone]  07/20/2020, 12:14 PM  [place] [address] [place], [name], [address] Phone: [phone]   Fax:  [phone]  Name: Paul Morales MRN: [id number] Date of Birth: 1975-08-29

## 2020-07-26 ENCOUNTER — Encounter (HOSPITAL_COMMUNITY): Payer: Self-pay

## 2020-07-26 ENCOUNTER — Ambulatory Visit (HOSPITAL_COMMUNITY): Payer: 59

## 2020-07-26 ENCOUNTER — Other Ambulatory Visit: Payer: Self-pay

## 2020-07-26 DIAGNOSIS — M25511 Pain in right shoulder: Secondary | ICD-10-CM | POA: Diagnosis not present

## 2020-07-26 DIAGNOSIS — R29898 Other symptoms and signs involving the musculoskeletal system: Secondary | ICD-10-CM

## 2020-07-26 NOTE — Therapy (Signed)
[place], [name], [address] Phone: [phone]   Fax:  [phone]  Occupational Therapy Treatment  Patient Details  Name: Paul Morales MRN: [id number] Date of Birth: 04-May-1976 Referring Provider (OT): Clemmie Krill, [place]   Encounter Date: 07/26/2020   OT End of Session - 07/26/20 1017    Visit Number 8    Number of Visits 10    Date for OT Re-Evaluation 08/09/20    Authorization Type *New insurance* [employer] $20 copay    Authorization Time Period (07/07/20-07/06/21)    OT Start Time 0820    OT Stop Time 0855    OT Time Calculation (min) 35 min    Activity Tolerance Patient tolerated treatment well    Behavior During Therapy [facility] for tasks assessed/performed           Past Medical History:  Diagnosis Date  . Anemia   . Crohn's disease ([place])   . Heart murmur    "T9" per patient  . History of kidney stones    surgery to remove  . HSV infection   . IBS (irritable bowel syndrome) 04/08/2016    Past Surgical History:  Procedure Laterality Date  . ANTERIOR CERVICAL DECOMP/DISCECTOMY FUSION N/A 01/18/2020   Procedure: c3-4, c4-5 ANTERIOR CERVICAL DECOMPRESSION/DISCECTOMY FUSION, ALLOGRAFT, PLATE;  Surgeon: Marybelle Killings, MD;  Location: [place];  Service: Orthopedics;  Laterality: N/A;  . COLONOSCOPY N/A 12/22/2016   Procedure: COLONOSCOPY;  Surgeon: Rogene Houston, MD;  [facility];  Service: Endoscopy;  Laterality: N/A;  12:00  . LITHOTRIPSY    . VASECTOMY    . WISDOM TOOTH EXTRACTION      There were no vitals filed for this visit.   Subjective Assessment - 07/26/20 0824    Subjective  S: I haven't done anything with it and it is so sore.    Currently in Pain? Yes    Pain Score 9     Pain Location Shoulder    Pain Orientation Right    Pain Descriptors / Indicators Sore    Pain Type Acute pain    Pain Radiating Towards Shoulder joint to shoulder blade    Pain Onset Other  (comment)   2 days ago   Pain Frequency Constant    Aggravating Factors  unsure.    Pain Relieving Factors Nothing helps. Slight help with heat pad and massage    Effect of Pain on Daily Activities Severe effect              [facility] OT Assessment - 07/26/20 0836      Assessment   Medical Diagnosis right shoulder pain      Precautions   Precautions None                    OT Treatments/Exercises (OP) - 07/26/20 0836      Exercises   Exercises Shoulder      Shoulder Exercises: Seated   Protraction AROM;10 reps    Horizontal ABduction AROM;10 reps    External Rotation AROM;10 reps    Internal Rotation AROM;10 reps    Flexion AROM;10 reps    Other Seated Exercises Boxed shape shoulder rolls backward; 10X (elevation, retraction, depression)      Shoulder Exercises: ROM/Strengthening   UBE (Upper Arm Bike) Level 3 4' reverse   pace: 9.5   X to V Arms 10X      Modalities   Modalities Moist Heat;Electrical  Stimulation      Moist Heat Therapy   Number Minutes Moist Heat 10 Minutes    Moist Heat Location Shoulder      Electrical Stimulation   Electrical Stimulation Location right shoulder    Electrical Stimulation Action interferential    Electrical Stimulation Parameters 9.4 CV    Electrical Stimulation Goals Pain                    OT Short Term Goals - 07/20/20 1213      OT SHORT TERM GOAL #1   Title Patient will be educated and independent with HEP in order to faciliate progress in therapy and allow him to complete required work activities with less pain and greater shoulder stability.    Time 3    Period Weeks    Target Date 07/05/20      OT SHORT TERM GOAL #2   Title Patient will decrease right shoulder/scapular fascial restrictions to min amount or less in order to decrease pain level and allow for better scapular alignment overall when complete reaching tasks.    Time 3    Period Weeks      OT SHORT TERM GOAL #3   Title Patient will  report a decrease in pain of no more than 3/10 overall in right shoulder while utilizing pain management techniques and HEP provided during therapy sessions.    Time 3    Period Weeks    Status On-going      OT SHORT TERM GOAL #4   Title Patient will demonstrate improved postural control with decreased shoulder rounding and cervical alignment over shoulders while increasing his scapular strength and stability and allowing him to complete required work tasks.    Time 3    Period Weeks    Status On-going                    Plan - 07/26/20 9563    Clinical Impression Statement A: Pt with increased soreness upon arrival to session. Unable to tolerate any touch to attempt manual techniques such as myofascial release. ES and moist heat was utilized to decrease pain and fascial restrictions. Completed seated A/ROM exercises afterwards with UBE bike to finish. Patient was provided with printout of information on how to purchase his own TENS unit if he would like. Pt reports a pain score of 6/10 at end of session. Recommended continuing with gentle use of RUE while focusing on A/ROM when pain level is high.    Body Structure / Function / Physical Skills ADL;UE functional use;Pain;Fascial restriction;Strength    Plan P: Progress to green band for scapular strengthening if able to tolerate. Follow up on pain.    Consulted and Agree with Plan of Care Patient           Patient will benefit from skilled therapeutic intervention in order to improve the following deficits and impairments:   Body Structure / Function / Physical Skills: ADL,UE functional use,Pain,Fascial restriction,Strength       Visit Diagnosis: Acute pain of right shoulder  Other symptoms and signs involving the musculoskeletal system    Problem List Patient Active Problem List   Diagnosis Date Noted  . HNP (herniated nucleus pulposus), cervical 01/18/2020  . Spinal stenosis of cervical region 08/08/2018  .  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 Enteritis 12/04/2016  . IBS (irritable bowel syndrome) 04/08/2016   Ailene Ravel, OTR/L,CBIS  [phone]  07/26/2020, 11:37 AM  [place] [address] [place], [name], [address] Phone: [phone]   Fax:  [phone]  Name: Paul Morales MRN: [id number] Date of Birth: 13-May-1976

## 2020-08-01 ENCOUNTER — Institutional Professional Consult (permissible substitution): Payer: Self-pay | Admitting: Internal Medicine

## 2020-08-01 NOTE — Progress Notes (Incomplete)
   Paul Morales, male    DOB: [birth year], 45 y.o.   MRN: [id number]   Brief patient profile:      History of Present Illness  08/01/2020  Pulmonary/ 1st office eval/ Melvyn Novas / [place] [facility]  No chief complaint on file.    Dyspnea:  *** Cough: *** Sleep: *** SABA use:   Past Medical History:  Diagnosis Date  . Anemia   . Crohn's disease ([place])   . Heart murmur    "T9" per patient  . History of kidney stones    surgery to remove  . HSV infection   . IBS (irritable bowel syndrome) 04/08/2016    Outpatient Medications Prior to Visit  Medication Sig Dispense Refill  . Adalimumab (HUMIRA) 40 MG/0.4ML PSKT Inject 40 mg into the skin every 14 (fourteen) days.     . ferrous sulfate 325 (65 FE) MG tablet Take 325 mg by mouth 3 (three) times daily.     . methocarbamol (ROBAXIN) 500 MG tablet Take 1 tablet (500 mg total) by mouth every 6 (six) hours as needed for muscle spasms. (Patient not taking: Reported on 06/13/2020) 60 tablet 0   No facility-administered medications prior to visit.     Objective:     There were no vitals taken for this visit.         Assessment   No problem-specific Assessment & Plan notes found for this encounter.     Christinia Gully, MD 08/01/2020

## 2020-08-02 ENCOUNTER — Telehealth (HOSPITAL_COMMUNITY): Payer: Self-pay

## 2020-08-02 ENCOUNTER — Ambulatory Visit (HOSPITAL_COMMUNITY): Payer: 59

## 2020-08-02 NOTE — Telephone Encounter (Signed)
pt did call to cx this appt message lonvm

## 2020-08-09 ENCOUNTER — Ambulatory Visit (HOSPITAL_COMMUNITY): Payer: 59 | Attending: Physician Assistant

## 2020-08-16 ENCOUNTER — Ambulatory Visit (HOSPITAL_COMMUNITY): Payer: 59 | Admitting: Occupational Therapy

## 2020-10-04 ENCOUNTER — Ambulatory Visit (INDEPENDENT_AMBULATORY_CARE_PROVIDER_SITE_OTHER): Payer: 59

## 2020-10-04 ENCOUNTER — Ambulatory Visit: Payer: Self-pay

## 2020-10-04 ENCOUNTER — Other Ambulatory Visit: Payer: Self-pay

## 2020-10-04 ENCOUNTER — Ambulatory Visit (INDEPENDENT_AMBULATORY_CARE_PROVIDER_SITE_OTHER): Payer: 59 | Admitting: Orthopaedic Surgery

## 2020-10-04 DIAGNOSIS — M25511 Pain in right shoulder: Secondary | ICD-10-CM | POA: Diagnosis not present

## 2020-10-04 DIAGNOSIS — M7541 Impingement syndrome of right shoulder: Secondary | ICD-10-CM | POA: Insufficient documentation

## 2020-10-04 DIAGNOSIS — M25512 Pain in left shoulder: Secondary | ICD-10-CM

## 2020-10-04 DIAGNOSIS — Z981 Arthrodesis status: Secondary | ICD-10-CM

## 2020-10-04 NOTE — Progress Notes (Addendum)
Office Visit Note   Patient: Paul Morales           Date of Birth: [birth year]           MRN: [id number] Visit Date: 10/04/2020              Requested by: Curlene Labrum, MD [place],  [place] [address] PCP: Curlene Labrum, MD   Assessment & Plan: Visit Diagnoses:  1. Bilateral shoulder pain, unspecified chronicity   2. Impingement syndrome of right shoulder   3. S/P cervical spinal fusion     Plan: Right shoulder subacromial injection performed which he tolerated well.  He can return if he has increased problems we discussed possible MRI imaging if his symptoms progress.  He gets great relief he can return for the opposite shoulder.  Follow-Up Instructions: No follow-ups on file.   Orders:  Orders Placed This Encounter  Procedures  . XR Shoulder Right  . XR Shoulder Left  . XR Cervical Spine 2 or 3 views   No orders of the defined types were placed in this encounter.     Procedures: No procedures performed   Clinical Data: No additional findings.   Subjective: Chief Complaint  Patient presents with  . Right Shoulder - Pain  . Left Shoulder - Pain    HPI 45 year old male is seen with bilateral shoulder pain.  He has a history of Crohn's disease had severe stenosis with cervical myelopathy and two-level cervical fusion 6 3 to C5 on 01/18/2020.  He noticed improvement in his gait improvement in strength.  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  He is used ibuprofen without relief.  He notices some stiffness in his neck but preop arm hand numbness and gait disturbance has resolved.  Review of Systems all other systems are noncontributory to HPI.   Objective: Vital Signs: There were no vitals taken for this  visit.  Physical Exam Constitutional:      Appearance: He is well-developed.  HENT:     Head: Normocephalic and atraumatic.  Eyes:     Pupils: Pupils are equal, round, and reactive to light.  Neck:     Thyroid: No thyromegaly.     Trachea: No tracheal deviation.  Cardiovascular:     Rate and Rhythm: Normal rate.  Pulmonary:     Effort: Pulmonary effort is normal.     Breath sounds: No wheezing.  Abdominal:     General: Bowel sounds are normal.     Palpations: Abdomen is soft.  Skin:    General: Skin is warm and dry.     Capillary Refill: Capillary refill takes less than 2 seconds.  Neurological:     Mental Status: He is alert and oriented to person, place, and time.  Psychiatric:        Behavior: Behavior normal.        Thought Content: Thought content normal.        Judgment: Judgment normal.     Ortho Exam well-healed anterior cervical incision.  Positive impingement right greater than left shoulder.  Negative Hawkins positive Neer.  Negative drop arm test.  No lower extremity hyperreflexia normal heel toe gait no clonus.   Specialty Comments:  No specialty  comments available.  Imaging: No results found.   PMFS History: Patient Active Problem List   Diagnosis Date Noted  . Impingement syndrome of right shoulder 10/04/2020  . S/P cervical spinal fusion 10/04/2020  . HNP (herniated nucleus pulposus), cervical 01/18/2020  . Enteritis 12/04/2016  . IBS (irritable bowel syndrome) 04/08/2016   Past Medical History:  Diagnosis Date  . Anemia   . Crohn's disease ([facility])   . Heart murmur    "T9" per patient  . History of kidney stones    surgery to remove  . HSV infection   . IBS (irritable bowel syndrome) 04/08/2016    Family History  Problem Relation Age of Onset  . Diabetes Mother   . Hypertension Father     Past Surgical History:  Procedure Laterality Date  . ANTERIOR CERVICAL DECOMP/DISCECTOMY FUSION N/A 01/18/2020   Procedure: c3-4, c4-5 ANTERIOR  CERVICAL DECOMPRESSION/DISCECTOMY FUSION, ALLOGRAFT, PLATE;  Surgeon: Marybelle Killings, MD;  Location: [facility];  Service: Orthopedics;  Laterality: N/A;  . COLONOSCOPY N/A 12/22/2016   Procedure: COLONOSCOPY;  Surgeon: Rogene Houston, MD;  [facility];  Service: Endoscopy;  Laterality: N/A;  12:00  . LITHOTRIPSY    . VASECTOMY    . WISDOM TOOTH EXTRACTION     Social History   Occupational History  . Not on file  Tobacco Use  . Smoking status: Current Every Day Smoker    Years: 28.00    Types: Cigars  . Smokeless tobacco: Never Used  . Tobacco comment: 3-4 cigars per day  Vaping Use  . Vaping Use: Never used  Substance and Sexual Activity  . Alcohol use: Not Currently    Comment: occasionally   . Drug use: Yes    Types: Marijuana    Comment: per patient "smoke constantly, everyday of the week", last use 01/11/20  . Sexual activity: Not Currently

## 2020-10-25 ENCOUNTER — Ambulatory Visit (INDEPENDENT_AMBULATORY_CARE_PROVIDER_SITE_OTHER): Payer: 59 | Admitting: Orthopaedic Surgery

## 2020-10-25 ENCOUNTER — Other Ambulatory Visit: Payer: Self-pay

## 2020-10-25 DIAGNOSIS — M7541 Impingement syndrome of right shoulder: Secondary | ICD-10-CM

## 2020-10-25 NOTE — Progress Notes (Signed)
Office Visit Note   Patient: Paul Morales           Date of Birth: 12/30/1975           MRN: [id number] Visit Date: 10/25/2020              Requested by: Curlene Labrum, MD [place],  [place] [address] PCP: Curlene Labrum, MD   Assessment & Plan: Visit Diagnoses:  1. Impingement syndrome of right shoulder     Plan: Patient like to proceed with MRI scan to rule out partial rotator cuff tear with his persistent symptoms.  He can return after scan for review he has been treated with subacromial injection, anti-inflammatories, Tylenol, home exercise program without relief.  Follow-Up Instructions: No follow-ups on file.   Orders:  Orders Placed This Encounter  Procedures  . MR SHOULDER RIGHT WO CONTRAST   No orders of the defined types were placed in this encounter.     Procedures: No procedures performed   Clinical Data: No additional findings.   Subjective: Chief Complaint  Patient presents with  . Shoulder Pain    HPI 45 year old male returns with bilateral shoulder pain.  He has pain with outstretch reaching and overhead activities.  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  He states he has difficulty getting comfortable can still get his hand to the top of his head.  Feels tight in his shoulders.  Previous cervical fusion July 2021 doing well.  Good relief of numbness and tingling in his hand.  Subacromial injection in his shoulder did not give him any relief.  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  Review of Systems updated unchanged from 10/04/2020 office visit.   Objective: Vital Signs: There were no vitals taken for this visit.  Physical Exam Constitutional:      Appearance: He is well-developed.  HENT:     Head: Normocephalic and atraumatic.  Eyes:     Pupils: Pupils are equal, round, and reactive to light.  Neck:     Thyroid: No  thyromegaly.     Trachea: No tracheal deviation.  Cardiovascular:     Rate and Rhythm: Normal rate.  Pulmonary:     Effort: Pulmonary effort is normal.     Breath sounds: No wheezing.  Abdominal:     General: Bowel sounds are normal.     Palpations: Abdomen is soft.  Skin:    General: Skin is warm and dry.     Capillary Refill: Capillary refill takes less than 2 seconds.  Neurological:     Mental Status: He is alert and oriented to person, place, and time.  Psychiatric:        Behavior: Behavior normal.        Thought Content: Thought content normal.        Judgment: Judgment normal.     Ortho Exam good cervical range of motion negative Spurling no brachial plexus tenderness.  Positive impingement right shoulder.  Positive impingement left shoulder negative drop arm test right and left.  Internal and external rotation is strong upper extremity reflexes are 2+ and symmetrical. Specialty Comments:  No specialty comments available.  Imaging: No results found.   PMFS History: Patient Active Problem List   Diagnosis Date Noted  . Impingement syndrome of right shoulder 10/04/2020  . S/P cervical spinal fusion 10/04/2020  . HNP (  herniated nucleus pulposus), cervical 01/18/2020  . Enteritis 12/04/2016  . IBS (irritable bowel syndrome) 04/08/2016   Past Medical History:  Diagnosis Date  . Anemia   . Crohn's disease ([facility])   . Heart murmur    "T9" per patient  . History of kidney stones    surgery to remove  . HSV infection   . IBS (irritable bowel syndrome) 04/08/2016    Family History  Problem Relation Age of Onset  . Diabetes Mother   . Hypertension Father     Past Surgical History:  Procedure Laterality Date  . ANTERIOR CERVICAL DECOMP/DISCECTOMY FUSION N/A 01/18/2020   Procedure: c3-4, c4-5 ANTERIOR CERVICAL DECOMPRESSION/DISCECTOMY FUSION, ALLOGRAFT, PLATE;  Surgeon: Marybelle Killings, MD;  Location: [facility];  Service: Orthopedics;  Laterality: N/A;  . COLONOSCOPY N/A  12/22/2016   Procedure: COLONOSCOPY;  Surgeon: Rogene Houston, MD;  [facility];  Service: Endoscopy;  Laterality: N/A;  12:00  . LITHOTRIPSY    . VASECTOMY    . WISDOM TOOTH EXTRACTION     Social History   Occupational History  . Not on file  Tobacco Use  . Smoking status: Current Every Day Smoker    Years: 28.00    Types: Cigars  . Smokeless tobacco: Never Used  . Tobacco comment: 3-4 cigars per day  Vaping Use  . Vaping Use: Never used  Substance and Sexual Activity  . Alcohol use: Not Currently    Comment: occasionally   . Drug use: Yes    Types: Marijuana    Comment: per patient "smoke constantly, everyday of the week", last use 01/11/20  . Sexual activity: Not Currently

## 2020-10-31 ENCOUNTER — Telehealth: Payer: Self-pay

## 2020-10-31 ENCOUNTER — Other Ambulatory Visit: Payer: Self-pay | Admitting: Orthopaedic Surgery

## 2020-10-31 NOTE — Telephone Encounter (Signed)
Patient called asking for something for pain.  He isn't having his MRI until 11/12/20.  Please advise.

## 2020-10-31 NOTE — Telephone Encounter (Signed)
FYI--- I called and discussed.  He can use Tylenol/Aleve rub some Aspercreme on his shoulder etc.  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  He will be returning to see me after MRI he understands.

## 2020-11-12 ENCOUNTER — Ambulatory Visit (HOSPITAL_COMMUNITY): Payer: 59

## 2020-11-14 ENCOUNTER — Ambulatory Visit (HOSPITAL_COMMUNITY): Payer: 59

## 2020-11-15 ENCOUNTER — Ambulatory Visit: Payer: 59 | Admitting: Orthopaedic Surgery

## 2020-11-27 ENCOUNTER — Ambulatory Visit (HOSPITAL_COMMUNITY)
Admission: RE | Admit: 2020-11-27 | Discharge: 2020-11-27 | Disposition: A | Discharge status: Home / Self Care | Payer: 59 | Source: Ambulatory Visit | Attending: Orthopaedic Surgery | Admitting: Orthopaedic Surgery

## 2020-11-27 DIAGNOSIS — M7541 Impingement syndrome of right shoulder: Secondary | ICD-10-CM | POA: Insufficient documentation

## 2020-12-06 ENCOUNTER — Other Ambulatory Visit: Payer: Self-pay

## 2020-12-06 ENCOUNTER — Encounter: Payer: Self-pay | Admitting: Orthopaedic Surgery

## 2020-12-06 ENCOUNTER — Encounter (HOSPITAL_COMMUNITY): Payer: Self-pay

## 2020-12-06 ENCOUNTER — Ambulatory Visit (INDEPENDENT_AMBULATORY_CARE_PROVIDER_SITE_OTHER): Payer: 59 | Admitting: Orthopaedic Surgery

## 2020-12-06 ENCOUNTER — Ambulatory Visit (INDEPENDENT_AMBULATORY_CARE_PROVIDER_SITE_OTHER): Payer: 59

## 2020-12-06 VITALS — Ht 65.0 in | Wt 113.6 lb

## 2020-12-06 DIAGNOSIS — Z981 Arthrodesis status: Secondary | ICD-10-CM

## 2020-12-06 DIAGNOSIS — M7541 Impingement syndrome of right shoulder: Secondary | ICD-10-CM | POA: Diagnosis not present

## 2020-12-06 NOTE — Therapy (Signed)
[facility] [place], [name], [address] Phone: [phone]   Fax:  [phone]  Patient Details  Name: Paul Morales MRN: [id number] Date of Birth: Jan 12, 1976 Referring Provider:  No ref. provider found  Encounter Date: 12/06/2020  OCCUPATIONAL THERAPY DISCHARGE SUMMARY  Visits from Start of Care: 8  Current functional level related to goals / functional outcomes:  OT SHORT TERM GOAL #1   Title Patient will be educated and independent with HEP in order to faciliate progress in therapy and allow him to complete required work activities with less pain and greater shoulder stability.    Time 3    Period Weeks    Target Date 07/05/20        OT SHORT TERM GOAL #2   Title Patient will decrease right shoulder/scapular fascial restrictions to min amount or less in order to decrease pain level and allow for better scapular alignment overall when complete reaching tasks.    Time 3    Period Weeks        OT SHORT TERM GOAL #3   Title Patient will report a decrease in pain of no more than 3/10 overall in right shoulder while utilizing pain management techniques and HEP provided during therapy sessions.    Time 3    Period Weeks    Status On-going        OT SHORT TERM GOAL #4   Title Patient will demonstrate improved postural control with decreased shoulder rounding and cervical alignment over shoulders while increasing his scapular strength and stability and allowing him to complete required work tasks.    Time 3    Period Weeks    Status On-going    Pt did not return to clinic after last visit. No communication was made to continue or discharge. Pt will be discharged due to failure to return.   Remaining deficits: Increased shoulder and neck pain, decreased scapular positioning and cervical alignment with poor postural control.   Education / Equipment: Shoulder HEP Plan:                                                     Patient goals were partially met. Patient is being discharged due to not returning since the last visit.  ?????         Ailene Ravel, OTR/L,CBIS  [phone]  12/06/2020, 3:49 PM  [facility] [address] [place], [name], [address] Phone: [phone]   Fax:  [phone]

## 2020-12-06 NOTE — Progress Notes (Signed)
Office Visit Note   Patient: Paul Morales           Date of Birth: 06/13/1976           MRN: [id number] Visit Date: 12/06/2020              Requested by: Curlene Labrum, MD [place],  [place] [address] PCP: Curlene Labrum, MD   Assessment & Plan: Visit Diagnoses: No diagnosis found.  Plan: Reviewed his MRI scan images again with copy of the report.  Repeat x-rays were obtained of the cervical spine which shows good position and alignment.  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  We discussed no surgery is indicated for his shoulder.  Neck images show satisfactory position of the grafts plate and screws.  He can follow-up in 6 months for repeat x-rays of his neck if he like.  Follow-Up Instructions: No follow-ups on file.   Orders:  No orders of the defined types were placed in this encounter.  No orders of the defined types were placed in this encounter.     Procedures: No procedures performed   Clinical Data: No additional findings.   Subjective: Chief Complaint  Patient presents with  . Right Shoulder - Pain, Follow-up    MRI review    HPI 45 year old male returns post right shoulder MRI scan done for continued shoulder pain and scan date was 11/27/2020.  Patient had previous cervical fusion to level July 2021.  He had persistent shoulder pain failed subacromial injection.  MRI scan shows some supraspinatus and infraspinatus tendinopathy without tearing.  No arthritis.  Muscles are normal.  Patient admits some days being out written by his PCP he states they were not excepted and he was terminated from his job.  Review of Systems positive history of enteritis, IBS.  Two-level cervical fusion July 2021.  Negative for chills fever no myelopathic symptoms.   Objective: Vital Signs: Ht 5' 5"  (1.651 m)   Wt 113 lb 9.6 oz (51.5 kg)   BMI 18.90 kg/m   Physical Exam Constitutional:       Appearance: He is well-developed.  HENT:     Head: Normocephalic and atraumatic.  Eyes:     Pupils: Pupils are equal, round, and reactive to light.  Neck:     Thyroid: No thyromegaly.     Trachea: No tracheal deviation.  Cardiovascular:     Rate and Rhythm: Normal rate.  Pulmonary:     Effort: Pulmonary effort is normal.     Breath sounds: No wheezing.  Abdominal:     General: Bowel sounds are normal.     Palpations: Abdomen is soft.  Skin:    General: Skin is warm and dry.     Capillary Refill: Capillary refill takes less than 2 seconds.  Neurological:     Mental Status: He is alert and oriented to person, place, and time.  Psychiatric:        Behavior: Behavior normal.        Thought Content: Thought content normal.        Judgment: Judgment normal.     Ortho Exam patient has well-healed cervical incision.  Intact upper certainly reflexes normal heel toe gait.  Specialty Comments:  No specialty comments available.  Imaging: CLINICAL DATA:  Right shoulder pain for 1 year.  No known injury.  EXAM: MRI OF THE RIGHT SHOULDER  WITHOUT CONTRAST  TECHNIQUE: Multiplanar, multisequence MR imaging of the shoulder was performed. No intravenous contrast was administered.  COMPARISON:  Plain films right shoulder 07/19/2018.  FINDINGS: Rotator cuff: There is some heterogeneously increased T2 signal in the supraspinatus and infraspinatus tendons consistent with tendinosis. No tear.  Muscles:  Normal without atrophy or focal lesion.  Biceps long head:  Intact and normal in appearance.  Acromioclavicular Joint: Appears normal. Type 2 acromion. No subacromial/subdeltoid bursal fluid.  Glenohumeral Joint: Appears normal.  Labrum:  Intact.  Bones:  Negative.  Other: None.  IMPRESSION: Mild to moderate appearing supraspinatus and infraspinatus tendinopathy without tear. The exam is otherwise negative.   Electronically Signed   By: Inge Rise  M.D.   On: 11/28/2020 13:46    PMFS History: Patient Active Problem List   Diagnosis Date Noted  . Impingement syndrome of right shoulder 10/04/2020  . S/P cervical spinal fusion 10/04/2020  . HNP (herniated nucleus pulposus), cervical 01/18/2020  . Enteritis 12/04/2016  . IBS (irritable bowel syndrome) 04/08/2016   Past Medical History:  Diagnosis Date  . Anemia   . Crohn's disease ([facility])   . Heart murmur    "T9" per patient  . History of kidney stones    surgery to remove  . HSV infection   . IBS (irritable bowel syndrome) 04/08/2016    Family History  Problem Relation Age of Onset  . Diabetes Mother   . Hypertension Father     Past Surgical History:  Procedure Laterality Date  . ANTERIOR CERVICAL DECOMP/DISCECTOMY FUSION N/A 01/18/2020   Procedure: c3-4, c4-5 ANTERIOR CERVICAL DECOMPRESSION/DISCECTOMY FUSION, ALLOGRAFT, PLATE;  Surgeon: Marybelle Killings, MD;  Location: [facility];  Service: Orthopedics;  Laterality: N/A;  . COLONOSCOPY N/A 12/22/2016   Procedure: COLONOSCOPY;  Surgeon: Rogene Houston, MD;  [facility];  Service: Endoscopy;  Laterality: N/A;  12:00  . LITHOTRIPSY    . VASECTOMY    . WISDOM TOOTH EXTRACTION     Social History   Occupational History  . Not on file  Tobacco Use  . Smoking status: Current Every Day Smoker    Years: 28.00    Types: Cigars  . Smokeless tobacco: Never Used  . Tobacco comment: 3-4 cigars per day  Vaping Use  . Vaping Use: Never used  Substance and Sexual Activity  . Alcohol use: Not Currently    Comment: occasionally   . Drug use: Yes    Types: Marijuana    Comment: per patient "smoke constantly, everyday of the week", last use 01/11/20  . Sexual activity: Not Currently

## 2021-04-25 ENCOUNTER — Other Ambulatory Visit: Payer: Self-pay

## 2021-04-25 ENCOUNTER — Encounter (HOSPITAL_COMMUNITY): Payer: Self-pay

## 2021-04-25 ENCOUNTER — Encounter (HOSPITAL_COMMUNITY)
Admission: RE | Admit: 2021-04-25 | Discharge: 2021-04-25 | Disposition: A | Discharge status: Home / Self Care | Payer: 59 | Source: Ambulatory Visit | Attending: Family Medicine | Admitting: Family Medicine

## 2021-04-25 DIAGNOSIS — K5080 Crohn's disease of both small and large intestine without complications: Secondary | ICD-10-CM | POA: Insufficient documentation

## 2021-04-25 MED ORDER — SODIUM CHLORIDE 0.9 % IV SOLN: INTRAVENOUS | Status: DC

## 2021-04-25 MED ORDER — USTEKINUMAB 130 MG/26ML IV SOLN
260.0000 mg | Freq: Once | INTRAVENOUS | Status: AC
  Administered 2021-04-25: 260 mg via INTRAVENOUS
  Filled 2021-04-25: qty 52

## 2022-03-20 ENCOUNTER — Ambulatory Visit (INDEPENDENT_AMBULATORY_CARE_PROVIDER_SITE_OTHER): Payer: BC Managed Care – PPO

## 2022-03-20 ENCOUNTER — Ambulatory Visit: Payer: Self-pay

## 2022-03-20 ENCOUNTER — Ambulatory Visit (INDEPENDENT_AMBULATORY_CARE_PROVIDER_SITE_OTHER): Payer: BC Managed Care – PPO | Admitting: Orthopaedic Surgery

## 2022-03-20 ENCOUNTER — Encounter: Payer: Self-pay | Admitting: Orthopaedic Surgery

## 2022-03-20 DIAGNOSIS — M542 Cervicalgia: Secondary | ICD-10-CM | POA: Diagnosis not present

## 2022-03-20 DIAGNOSIS — G8929 Other chronic pain: Secondary | ICD-10-CM

## 2022-03-20 DIAGNOSIS — M25511 Pain in right shoulder: Secondary | ICD-10-CM | POA: Diagnosis not present

## 2022-03-20 DIAGNOSIS — M25512 Pain in left shoulder: Secondary | ICD-10-CM

## 2022-03-20 NOTE — Progress Notes (Unsigned)
Office Visit Note   Patient: Paul Morales           Date of Birth: 1975/12/02           MRN: [id number] Visit Date: 03/20/2022              Requested by: Curlene Labrum, MD [place],  [place] [address] PCP: Curlene Labrum, MD   Assessment & Plan: Visit Diagnoses:  1. Neck pain   2. Chronic pain of both shoulders     Plan: We will set patient up for some physical therapy for treatment of his likely bilateral rotator cuff tendinopathy.  Reviewed x-rays with patient his fusion is solid.  Reviewed his MRI 2 years ago and that to significant levels with pathology were surgically treated and are now fused.  We will check patient back after her course of therapy.  Follow-Up Instructions: No follow-ups on file.   Orders:  Orders Placed This Encounter  Procedures   XR Cervical Spine 2 or 3 views   XR Shoulder Right   XR Shoulder Left   No orders of the defined types were placed in this encounter.     Procedures: No procedures performed   Clinical Data: No additional findings.   Subjective: Chief Complaint  Patient presents with   Neck - Pain   Left Shoulder - Pain   Right Shoulder - Pain    HPI 46 year old male returns has not been seen in a couple years.  Had previous two-level cervical fusion C3-4 C4-5 2021 by me which is healed.  He has had increased pain in his neck and into his shoulders with difficulty outstretched overhead activities.  Previous MRI scan right shoulder showed rotator cuff tendinopathy without full-thickness or partial-thickness tear.  He denies myelopathic symptoms no chills or fever.  Notes some discomfort with rotation of his neck with stiffness.  He is continuing to work.  Past history is includes cervical fusion history of IBS and shoulder impingement.  Review of Systems 14 point systems otherwise noncontributory.   Objective: Vital Signs: There were no vitals taken for this visit.  Physical Exam Constitutional:      Appearance:  He is well-developed.  HENT:     Head: Normocephalic and atraumatic.     Right Ear: External ear normal.     Left Ear: External ear normal.  Eyes:     Pupils: Pupils are equal, round, and reactive to light.  Neck:     Thyroid: No thyromegaly.     Trachea: No tracheal deviation.  Cardiovascular:     Rate and Rhythm: Normal rate.  Pulmonary:     Effort: Pulmonary effort is normal.     Breath sounds: No wheezing.  Abdominal:     General: Bowel sounds are normal.     Palpations: Abdomen is soft.  Musculoskeletal:     Cervical back: Neck supple.  Skin:    General: Skin is warm and dry.     Capillary Refill: Capillary refill takes less than 2 seconds.  Neurological:     Mental Status: He is alert and oriented to person, place, and time.  Psychiatric:        Behavior: Behavior normal.        Thought Content: Thought content normal.        Judgment: Judgment normal.     Ortho Exam positive impingement right shoulder and left shoulder.  Minimal brachial plexus tenderness.  No increased pain with cervical compression  no change with distraction.  Reflexes are 2+ and symmetrical negative drop arm test.  Long head of the biceps minimally tender.  Normal heel-toe gait no lower extremity weakness.  No clonus.  Specialty Comments:  No specialty comments available.  Imaging: No results found.   PMFS History: Patient Active Problem List   Diagnosis Date Noted   Impingement syndrome of right shoulder 10/04/2020   S/P cervical spinal fusion 10/04/2020   HNP (herniated nucleus pulposus), cervical 01/18/2020   Enteritis 12/04/2016   IBS (irritable bowel syndrome) 04/08/2016   Past Medical History:  Diagnosis Date   Anemia    Crohn's disease ([facility])    Heart murmur    "T9" per patient   History of kidney stones    surgery to remove   HSV infection    IBS (irritable bowel syndrome) 04/08/2016    Family History  Problem Relation Age of Onset   Diabetes Mother    Hypertension  Father     Past Surgical History:  Procedure Laterality Date   ANTERIOR CERVICAL DECOMP/DISCECTOMY FUSION N/A 01/18/2020   Procedure: c3-4, c4-5 ANTERIOR CERVICAL DECOMPRESSION/DISCECTOMY FUSION, ALLOGRAFT, PLATE;  Surgeon: Marybelle Killings, MD;  Location: [facility];  Service: Orthopedics;  Laterality: N/A;   COLONOSCOPY N/A 12/22/2016   Procedure: COLONOSCOPY;  Surgeon: Rogene Houston, MD;  [facility];  Service: Endoscopy;  Laterality: N/A;  12:00   LITHOTRIPSY     VASECTOMY     WISDOM TOOTH EXTRACTION     Social History   Occupational History   Not on file  Tobacco Use   Smoking status: Every Day    Types: Cigars   Smokeless tobacco: Never   Tobacco comments:    3-4 cigars per day  Vaping Use   Vaping Use: Never used  Substance and Sexual Activity   Alcohol use: Not Currently    Comment: occasionally    Drug use: Yes    Types: Marijuana    Comment: per patient "smoke constantly, everyday of the week", last use 01/11/20   Sexual activity: Not Currently

## 2022-05-01 ENCOUNTER — Ambulatory Visit: Payer: Self-pay | Admitting: Orthopaedic Surgery

## 2022-10-11 IMAGING — MR MR SHOULDER*R* W/O CM
5 series · 40 of 40 positions shown · non-contrast
Comparison: Plain films right shoulder 07/19/2018.

CLINICAL DATA: Right shoulder pain for 1 year.  No known injury.

EXAM:
MRI OF THE RIGHT SHOULDER WITHOUT CONTRAST
TECHNIQUE: Multiplanar, multisequence MR imaging of the shoulder was performed.
No intravenous contrast was administered.

[Series 5: T2 fat-sat · axial · right · 4.0mm · 0.47mm/px · z∈[-62,+54]mm · 8 of 26 slices shown (1 of 3)]
[im 1/26]
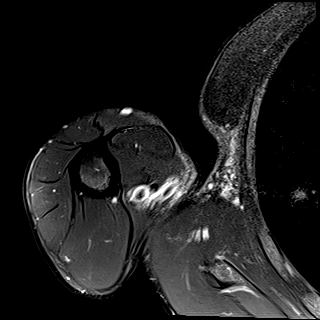
[im 4/26]
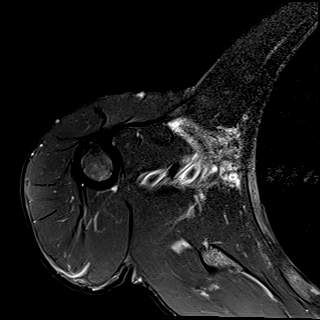
[im 8/26]
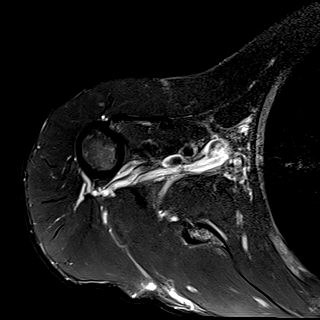
[im 11/26]
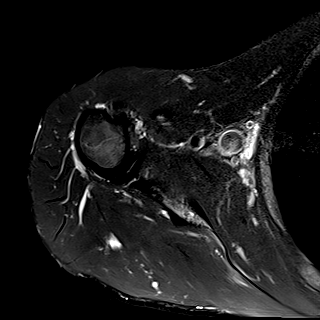
[im 15/26]
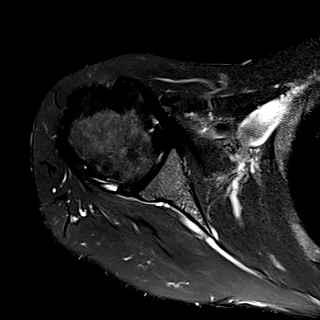
[im 18/26]
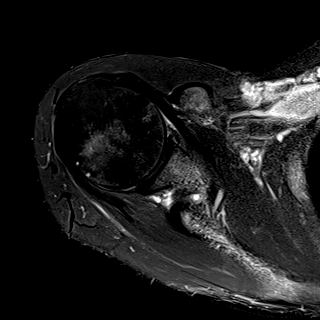
[im 22/26]
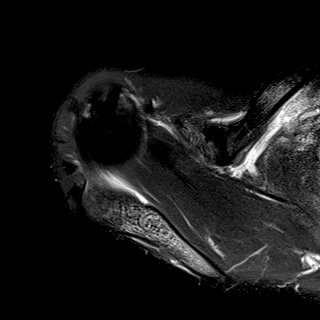
[im 26/26]
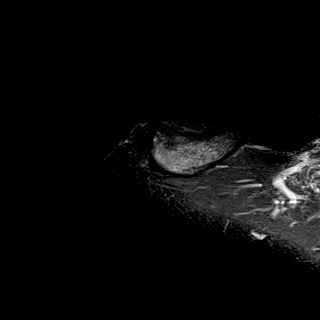

[Series 6: T2 fat-sat · oblique · right · 4.0mm · 0.47mm/px · 7 of 20 slices shown (2 of 3)]
[im 1/20]
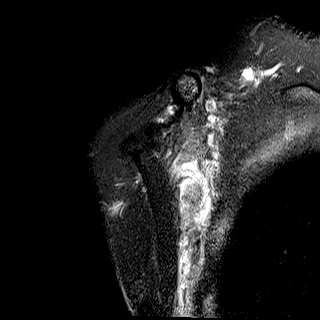
[im 4/20]
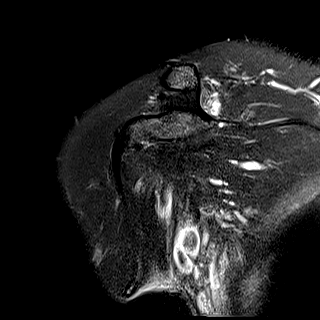
[im 7/20]
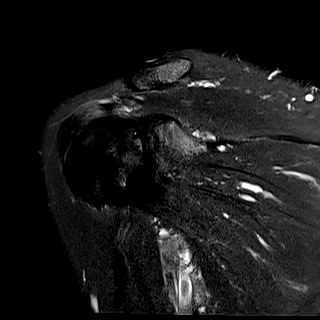
[im 10/20]
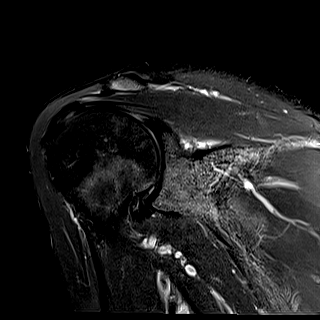
[im 13/20]
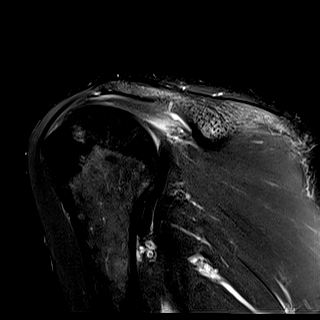
[im 16/20]
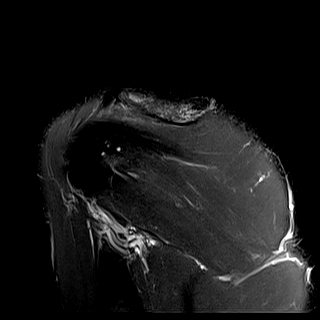
[im 20/20]
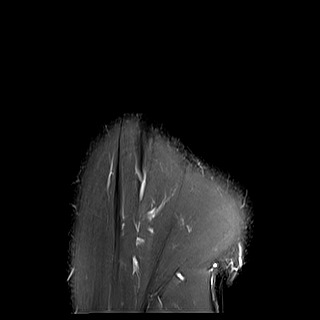

[Series 7: PD · oblique · right · 4.0mm · 0.47mm/px · 7 of 20 slices shown]
[im 1/20]
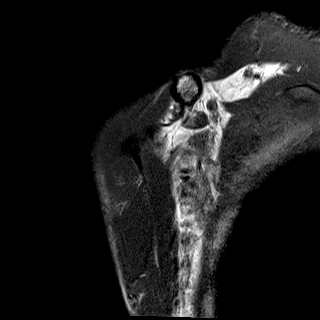
[im 4/20]
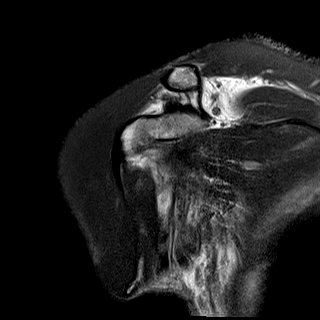
[im 7/20]
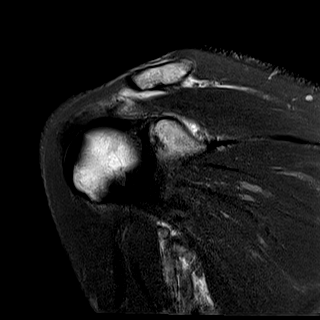
[im 10/20]
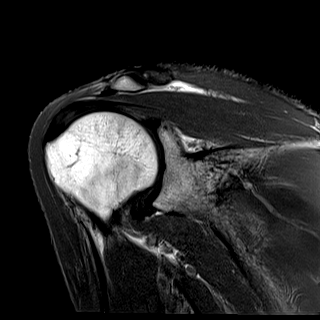
[im 13/20]
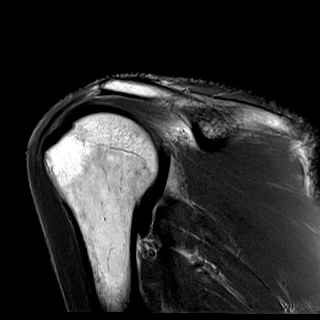
[im 16/20]
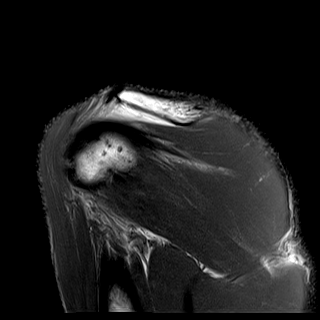
[im 20/20]
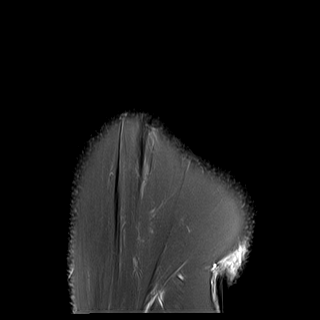

[Series 8: T2 fat-sat · oblique · right · 4.0mm · 0.44mm/px · 9 of 25 slices shown (3 of 3)]
[im 1/25]
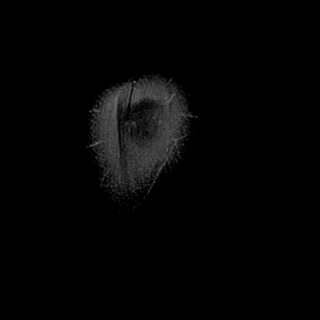
[im 4/25]
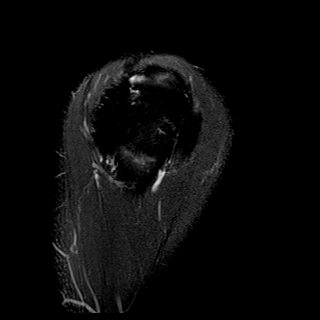
[im 7/25]
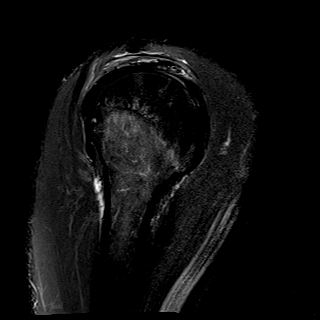
[im 10/25]
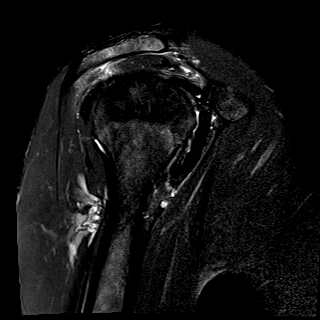
[im 13/25]
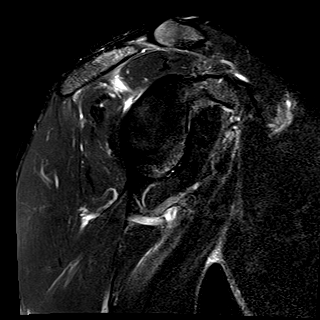
[im 16/25]
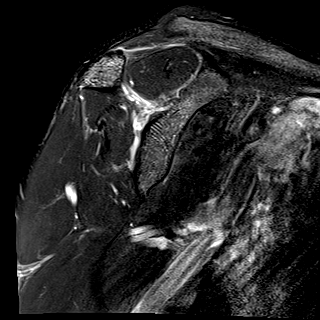
[im 19/25]
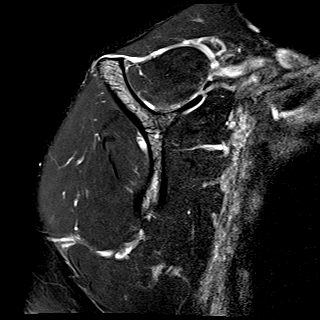
[im 22/25]
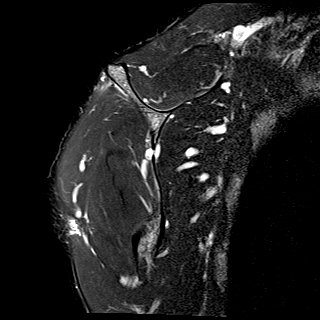
[im 25/25]
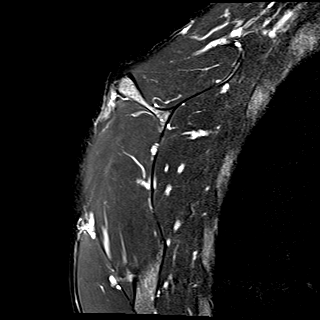

[Series 9: T1 · oblique · right · 4.0mm · 0.41mm/px · 9 of 25 slices shown]
[im 1/25]
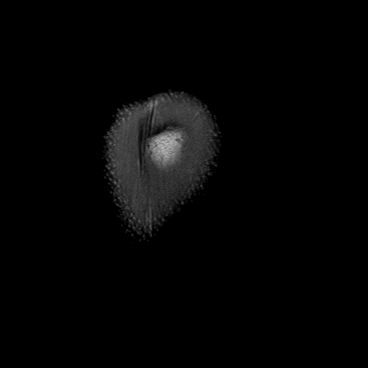
[im 4/25]
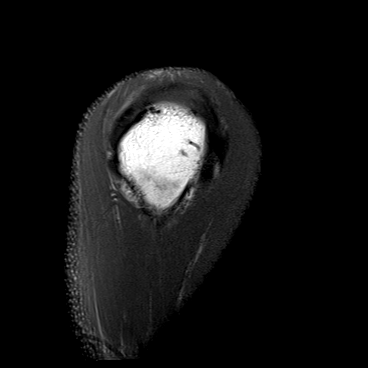
[im 7/25]
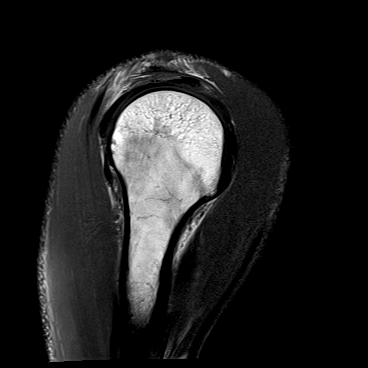
[im 10/25]
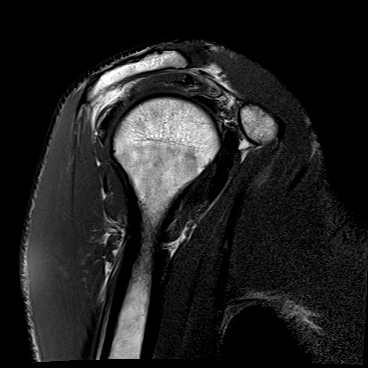
[im 13/25]
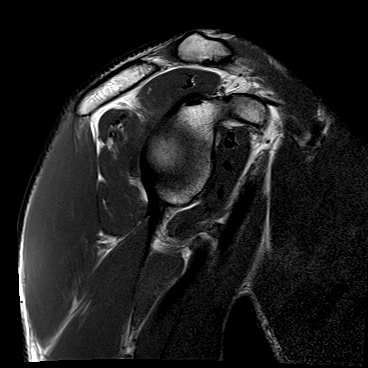
[im 16/25]
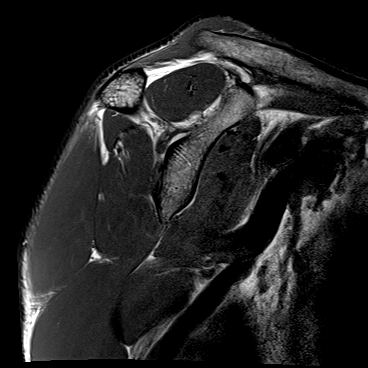
[im 19/25]
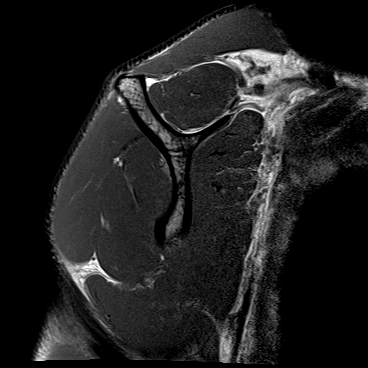
[im 22/25]
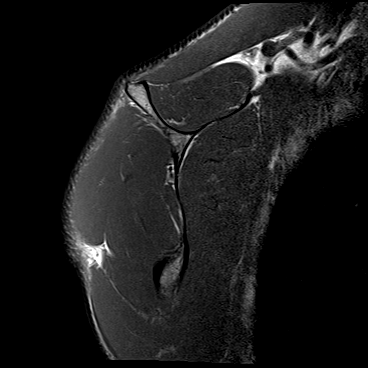
[im 25/25]
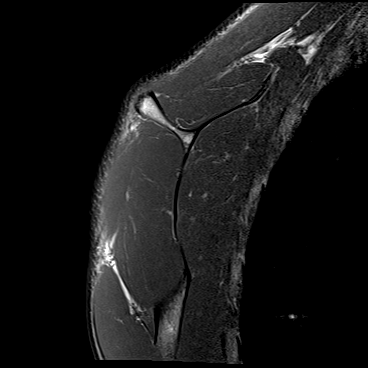

[40 of 40 positions shown; findings below may reference images not displayed]

FINDINGS: Rotator cuff: There is some heterogeneously increased T2 signal in
the supraspinatus and infraspinatus tendons consistent with
tendinosis. No tear.

Muscles:  Normal without atrophy or focal lesion.

Biceps long head:  Intact and normal in appearance.

Acromioclavicular Joint: Appears normal. Type 2 acromion. No
subacromial/subdeltoid bursal fluid.

Glenohumeral Joint: Appears normal.

Labrum:  Intact.

Bones:  Negative.

Other: None.
IMPRESSION: Mild to moderate appearing supraspinatus and infraspinatus
tendinopathy without tear. The exam is otherwise negative.

## 2024-02-11 NOTE — Progress Notes (Signed)
 This encounter was created in error - please disregard.

## 2024-05-13 ENCOUNTER — Ambulatory Visit
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
## 2024-05-20 ENCOUNTER — Ambulatory Visit: Attending: Cardiology | Admitting: Cardiology

## 2024-05-20 ENCOUNTER — Encounter: Payer: Self-pay | Admitting: Cardiology

## 2024-05-20 VITALS — BP 120/88 | HR 98 | Ht 65.0 in | Wt 122.0 lb

## 2024-05-20 DIAGNOSIS — R079 Chest pain, unspecified: Secondary | ICD-10-CM | POA: Diagnosis not present

## 2024-05-20 DIAGNOSIS — R011 Cardiac murmur, unspecified: Secondary | ICD-10-CM | POA: Diagnosis present

## 2024-05-20 DIAGNOSIS — R072 Precordial pain: Secondary | ICD-10-CM | POA: Insufficient documentation

## 2024-05-20 DIAGNOSIS — I499 Cardiac arrhythmia, unspecified: Secondary | ICD-10-CM | POA: Insufficient documentation

## 2024-05-20 NOTE — Patient Instructions (Signed)
 Testing/Procedures: ECHOCARDIOGRAM Your physician has requested that you have an echocardiogram. Echocardiography is a painless test that uses sound waves to create images of your heart. It provides your doctor with information about the size and shape of your heart and how well your heart's chambers and valves are working. This procedure takes approximately one hour. There are no restrictions for this procedure. Please do NOT wear cologne, perfume, aftershave, or lotions (deodorant is allowed). Please arrive 15 minutes prior to your appointment time.  Please note: We ask at that you not bring children with you during ultrasound (echo/ vascular) testing. Due to room size and safety concerns, children are not allowed in the ultrasound rooms during exams. Our front office staff cannot provide observation of children in our lobby area while testing is being conducted. An adult accompanying a patient to their appointment will only be allowed in the ultrasound room at the discretion of the ultrasound technician under special circumstances. We apologize for any inconvenience.   Follow-Up: At [facility], you and your health needs are our priority.  As part of our continuing mission to provide you with exceptional heart care, our providers are all part of one team.  This team includes your primary Cardiologist (physician) and Advanced Practice Providers or APPs (Physician Assistants and Nurse Practitioners) who all work together to provide you with the care you need, when you need it.  Your next appointment:   AS NEEDED  Provider:   Newman JINNY Lawrence, MD

## 2024-05-20 NOTE — Progress Notes (Signed)
  Cardiology Office Note:  .   Date:  05/20/2024  ID:  Paul Morales, DOB 1975-11-20, MRN [id number] PCP: Lari Elspeth BRAVO, MD  [facility] Providers Cardiologist:  Newman Lawrence, MD PCP: Lari Elspeth BRAVO, MD  No chief complaint on file.    Paul Morales is a 48 y.o. male with Crohn's disease, former  Discussed the use of AI scribe software for clinical note transcription with the patient, who gave verbal consent to proceed.  History of Present Illness Paul Morales is a 48 year old male who presents for evaluation of his cardiac health. He was referred by Dr. Lari for evaluation of his cardiac health.  He has a benign heart murmur confirmed by a cardiologist in 2021, with an echocardiogram performed the same year. He currently experiences no chest pain, shortness of breath, heart racing, or palpitations. He denies any history of heart attack or stroke. 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      Vitals:   05/20/24 0911  BP: 120/88  Pulse: 98  SpO2: 97%      Review of Systems  Cardiovascular:  Negative for chest pain, dyspnea on exertion, leg swelling, palpitations and syncope.        Studies Reviewed: SABRA        EKG 05/20/2024: Normal sinus rhythm Possible Left atrial enlargement Left ventricular hypertrophy ( Sokolow-Lyon , Cornell product ) Nonspecific ST and T wave abnormality When compared with ECG of 11-Jan-2020 11:23, Nonspecific T wave abnormality now evident in Lateral leads  Labs 11/2023: Hb 11.1 Cr 0.61   Physical Exam Vitals and nursing note reviewed.  Constitutional:      General: He is not in acute distress. Neck:     Vascular: No JVD.  Cardiovascular:     Rate and Rhythm: Normal rate and regular rhythm.     Heart sounds: Murmur heard.     High-pitched blowing holosystolic murmur is present with a grade of 3/6 at the apex.  Pulmonary:     Effort:  Pulmonary effort is normal.     Breath sounds: Normal breath sounds. No wheezing or rales.      VISIT DIAGNOSES:   ICD-10-CM   1. Murmur  R01.1 EKG 12-Lead    2. Precordial pain  R07.2 ECHOCARDIOGRAM COMPLETE       Paul Morales is a 47 y.o. male with Crohn's disease, murmur  Assessment & Plan Murmur: Prominent pansystolic murmur on exam today.  Patient tells me that she has been told in the past that he has a benign murmur.  Echocardiogram in 2021 did not report any significant valvular abnormalities but did show moderate left regurgitation.  I will repeat echocardiogram now to ensure that we are not missing an eccentric mitral regurgitation.  Precordial pain: He denies any chest pain symptoms to me now, reports that he had pains with muscle spasm with certain activity, but currently has no exertional chest pain symptoms.  Therefore, we will hold off any stress testing at this time.       F/u as needed  Signed, Newman JINNY Lawrence, MD

## 2024-06-28 ENCOUNTER — Ambulatory Visit: Payer: Self-pay | Admitting: Cardiology

## 2024-06-28 ENCOUNTER — Ambulatory Visit (HOSPITAL_COMMUNITY)
Admission: RE | Admit: 2024-06-28 | Discharge: 2024-06-28 | Disposition: A | Discharge status: Home / Self Care | Source: Ambulatory Visit | Attending: Internal Medicine | Admitting: Internal Medicine

## 2024-06-28 DIAGNOSIS — R072 Precordial pain: Secondary | ICD-10-CM | POA: Insufficient documentation

## 2024-06-28 DIAGNOSIS — R011 Cardiac murmur, unspecified: Secondary | ICD-10-CM | POA: Diagnosis not present

## 2024-06-28 LAB — ECHOCARDIOGRAM COMPLETE
Area-P 1/2: 3.1 cm2
S' Lateral: 2.9 cm
